# Patient Record
Sex: Male | Born: 1955
Health system: Southern US, Community
[De-identification: ages and names within clinical notes are randomized; demographics above are authoritative.]

## PROBLEM LIST (undated history)

## (undated) DIAGNOSIS — K759 Inflammatory liver disease, unspecified: Secondary | ICD-10-CM

## (undated) DIAGNOSIS — M1990 Unspecified osteoarthritis, unspecified site: Secondary | ICD-10-CM

## (undated) DIAGNOSIS — N400 Enlarged prostate without lower urinary tract symptoms: Secondary | ICD-10-CM

## (undated) DIAGNOSIS — M109 Gout, unspecified: Secondary | ICD-10-CM

## (undated) DIAGNOSIS — F1910 Other psychoactive substance abuse, uncomplicated: Secondary | ICD-10-CM

## (undated) DIAGNOSIS — Z8619 Personal history of other infectious and parasitic diseases: Secondary | ICD-10-CM

## (undated) HISTORY — DX: Other psychoactive substance abuse, uncomplicated: F19.10

## (undated) HISTORY — DX: Unspecified osteoarthritis, unspecified site: M19.90

## (undated) HISTORY — DX: Gout, unspecified: M10.9

## (undated) HISTORY — PX: WRIST SURGERY: SHX841

## (undated) HISTORY — DX: Personal history of other infectious and parasitic diseases: Z86.19

---

## 2004-12-11 ENCOUNTER — Inpatient Hospital Stay (HOSPITAL_COMMUNITY): Admission: EM | Admit: 2004-12-11 | Discharge: 2004-12-13 | Payer: Self-pay | Admitting: Emergency Medicine

## 2004-12-12 ENCOUNTER — Ambulatory Visit: Payer: Self-pay | Admitting: Psychiatry

## 2005-11-02 ENCOUNTER — Emergency Department (HOSPITAL_COMMUNITY): Admission: EM | Admit: 2005-11-02 | Discharge: 2005-11-02 | Payer: Self-pay | Admitting: Emergency Medicine

## 2006-04-19 ENCOUNTER — Emergency Department (HOSPITAL_COMMUNITY): Admission: EM | Admit: 2006-04-19 | Discharge: 2006-04-19 | Payer: Self-pay | Admitting: Emergency Medicine

## 2006-08-10 ENCOUNTER — Ambulatory Visit (HOSPITAL_COMMUNITY): Admission: RE | Admit: 2006-08-10 | Discharge: 2006-08-10 | Payer: Self-pay | Admitting: Orthopedic Surgery

## 2007-04-13 IMAGING — CR DG ORBITS FOR FOREIGN BODY
2 series · 2 of 2 positions shown · non-contrast
Comparison: none

CLINICAL DATA: Metal working/exposure; clearance prior to MRI.
 ORBITS FOR FOREIGN BODY -  VIEW:
 There is no evidence of metallic foreign body within the orbits.  No significant bone abnormality identified.

[view not recorded (1 of 2)]
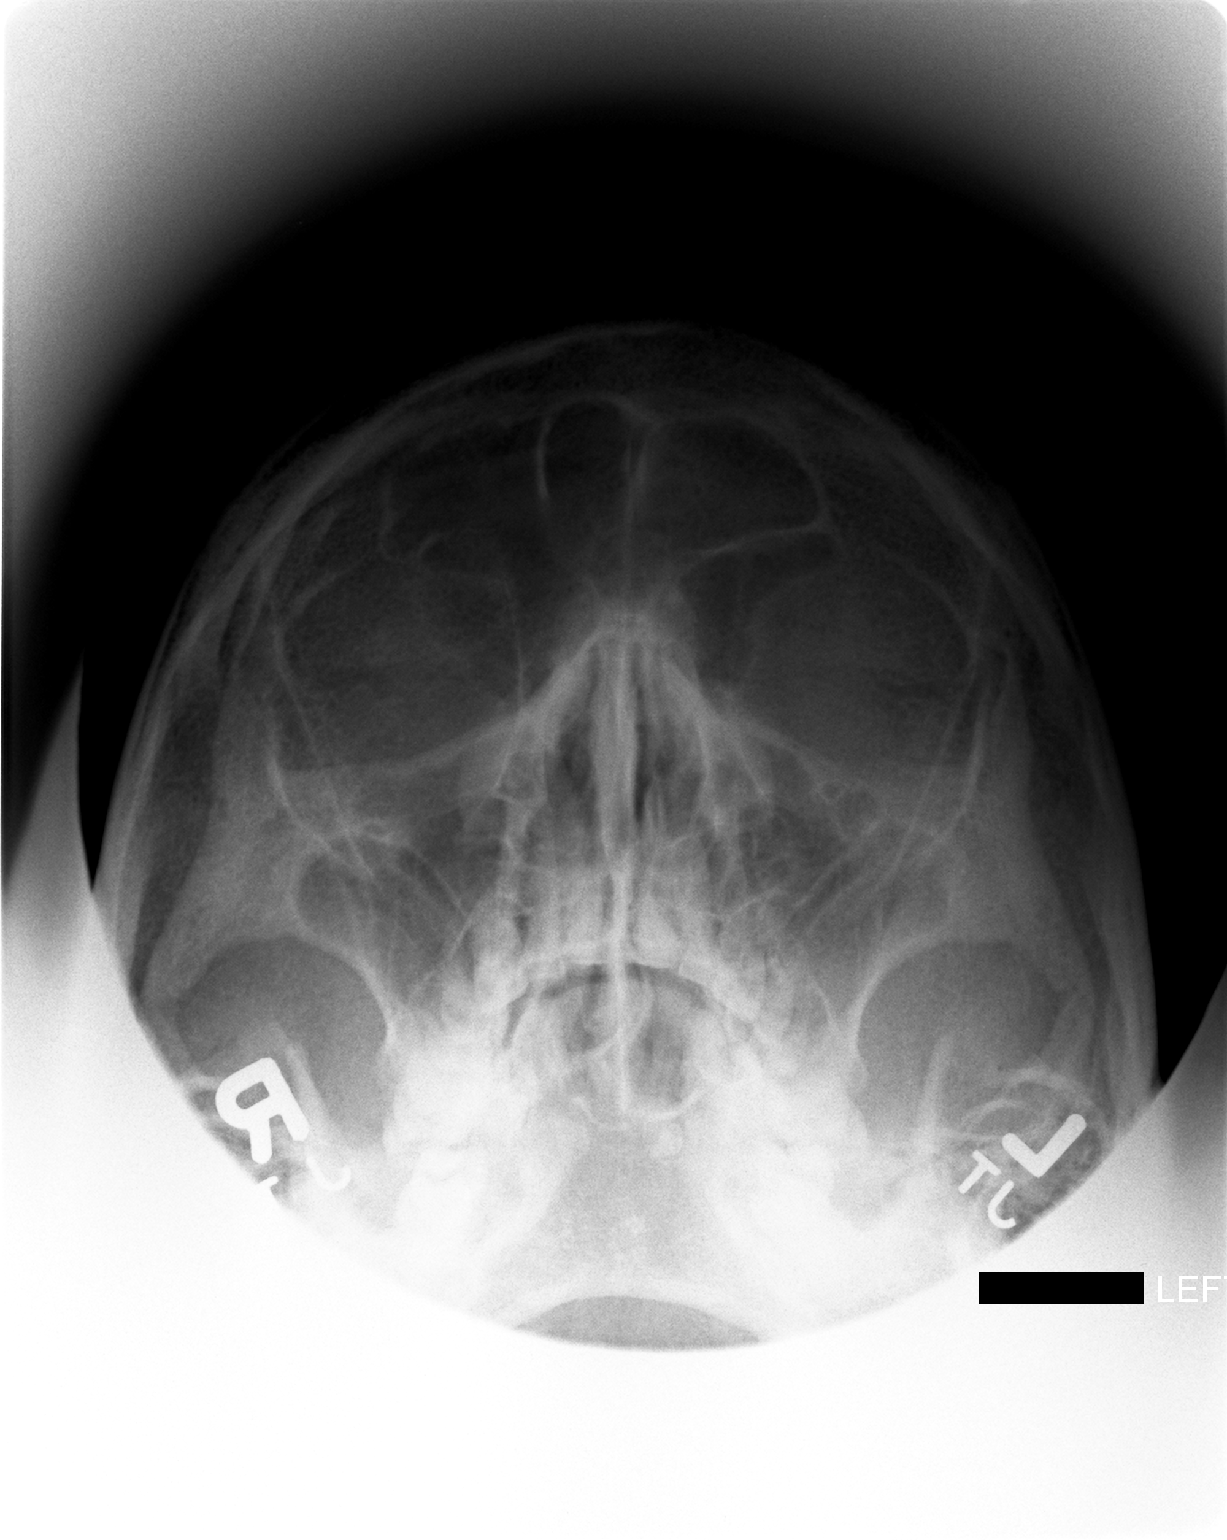

[view not recorded (2 of 2)]
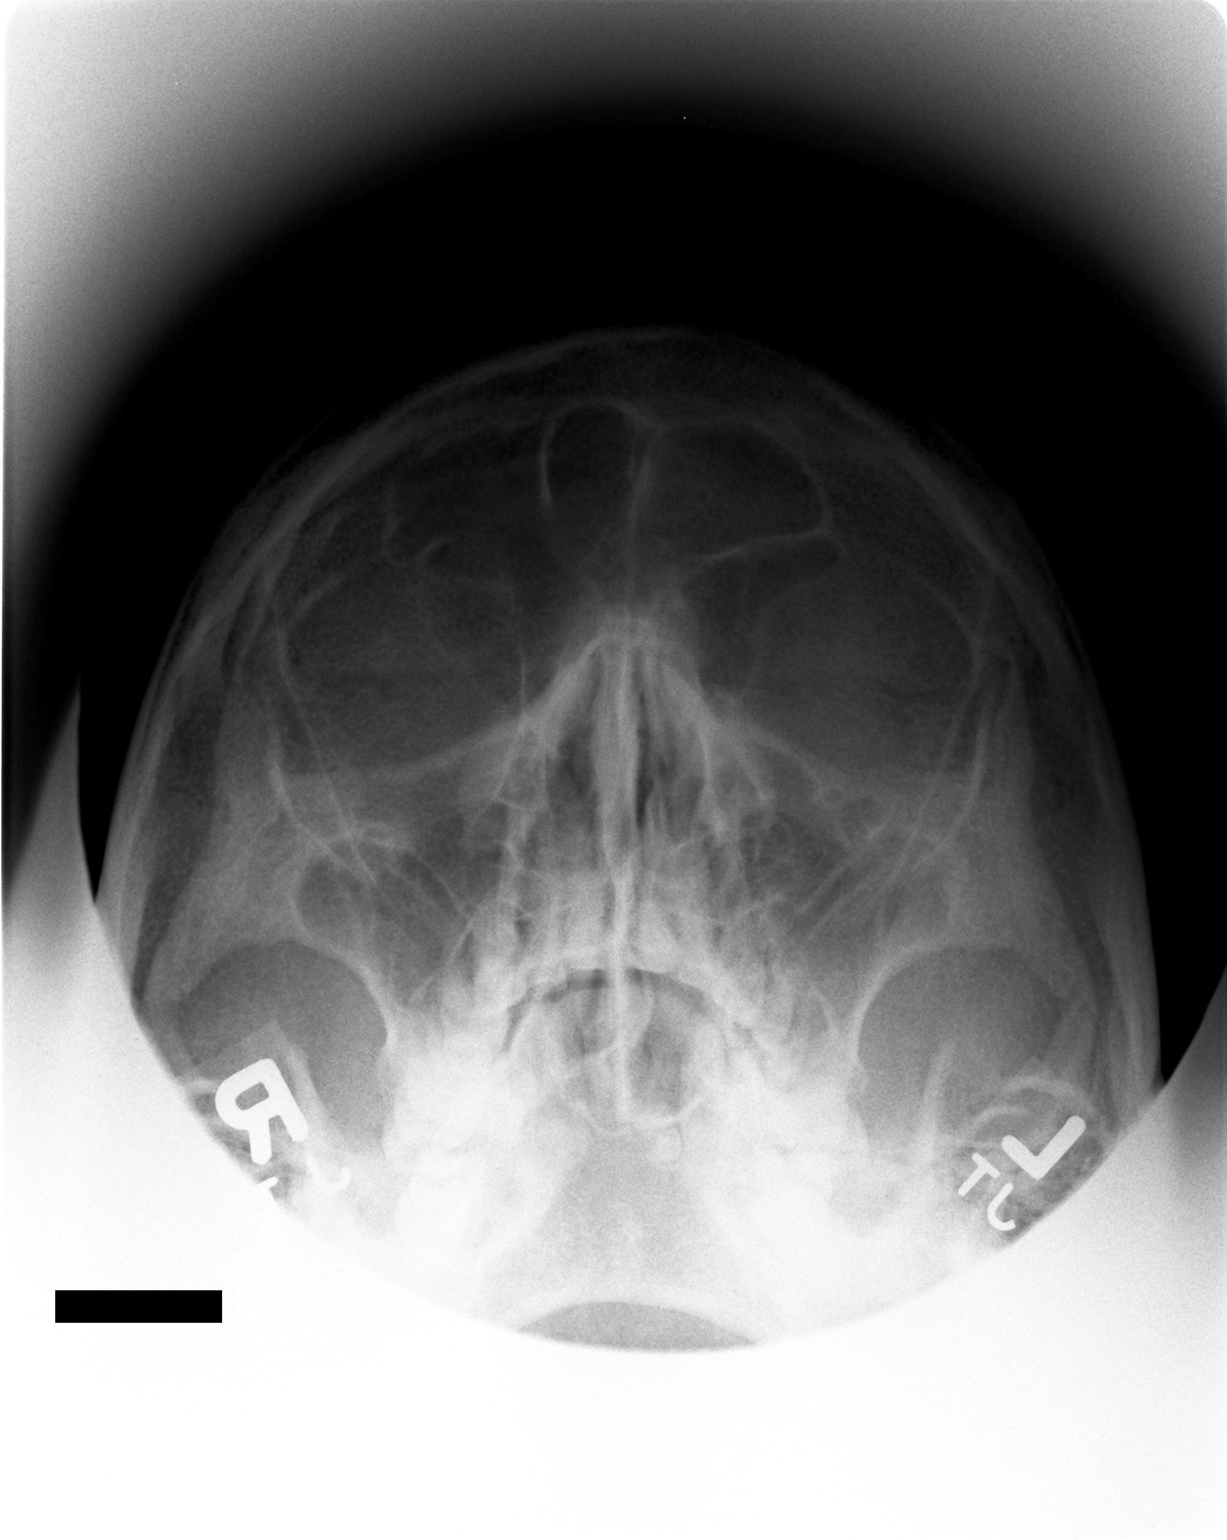

[2 of 2 positions shown; findings below may reference images not displayed]

IMPRESSION: No evidence of metallic foreign body within the orbits.

## 2008-11-24 ENCOUNTER — Emergency Department (HOSPITAL_COMMUNITY): Admission: EM | Admit: 2008-11-24 | Discharge: 2008-11-24 | Payer: Self-pay | Admitting: Emergency Medicine

## 2009-07-28 IMAGING — CR DG CHEST 2V
2 series · 2 of 2 positions shown · non-contrast
Comparison: None

CLINICAL DATA: Cough, chest pain

CHEST - 2 VIEW

[w chest pa]
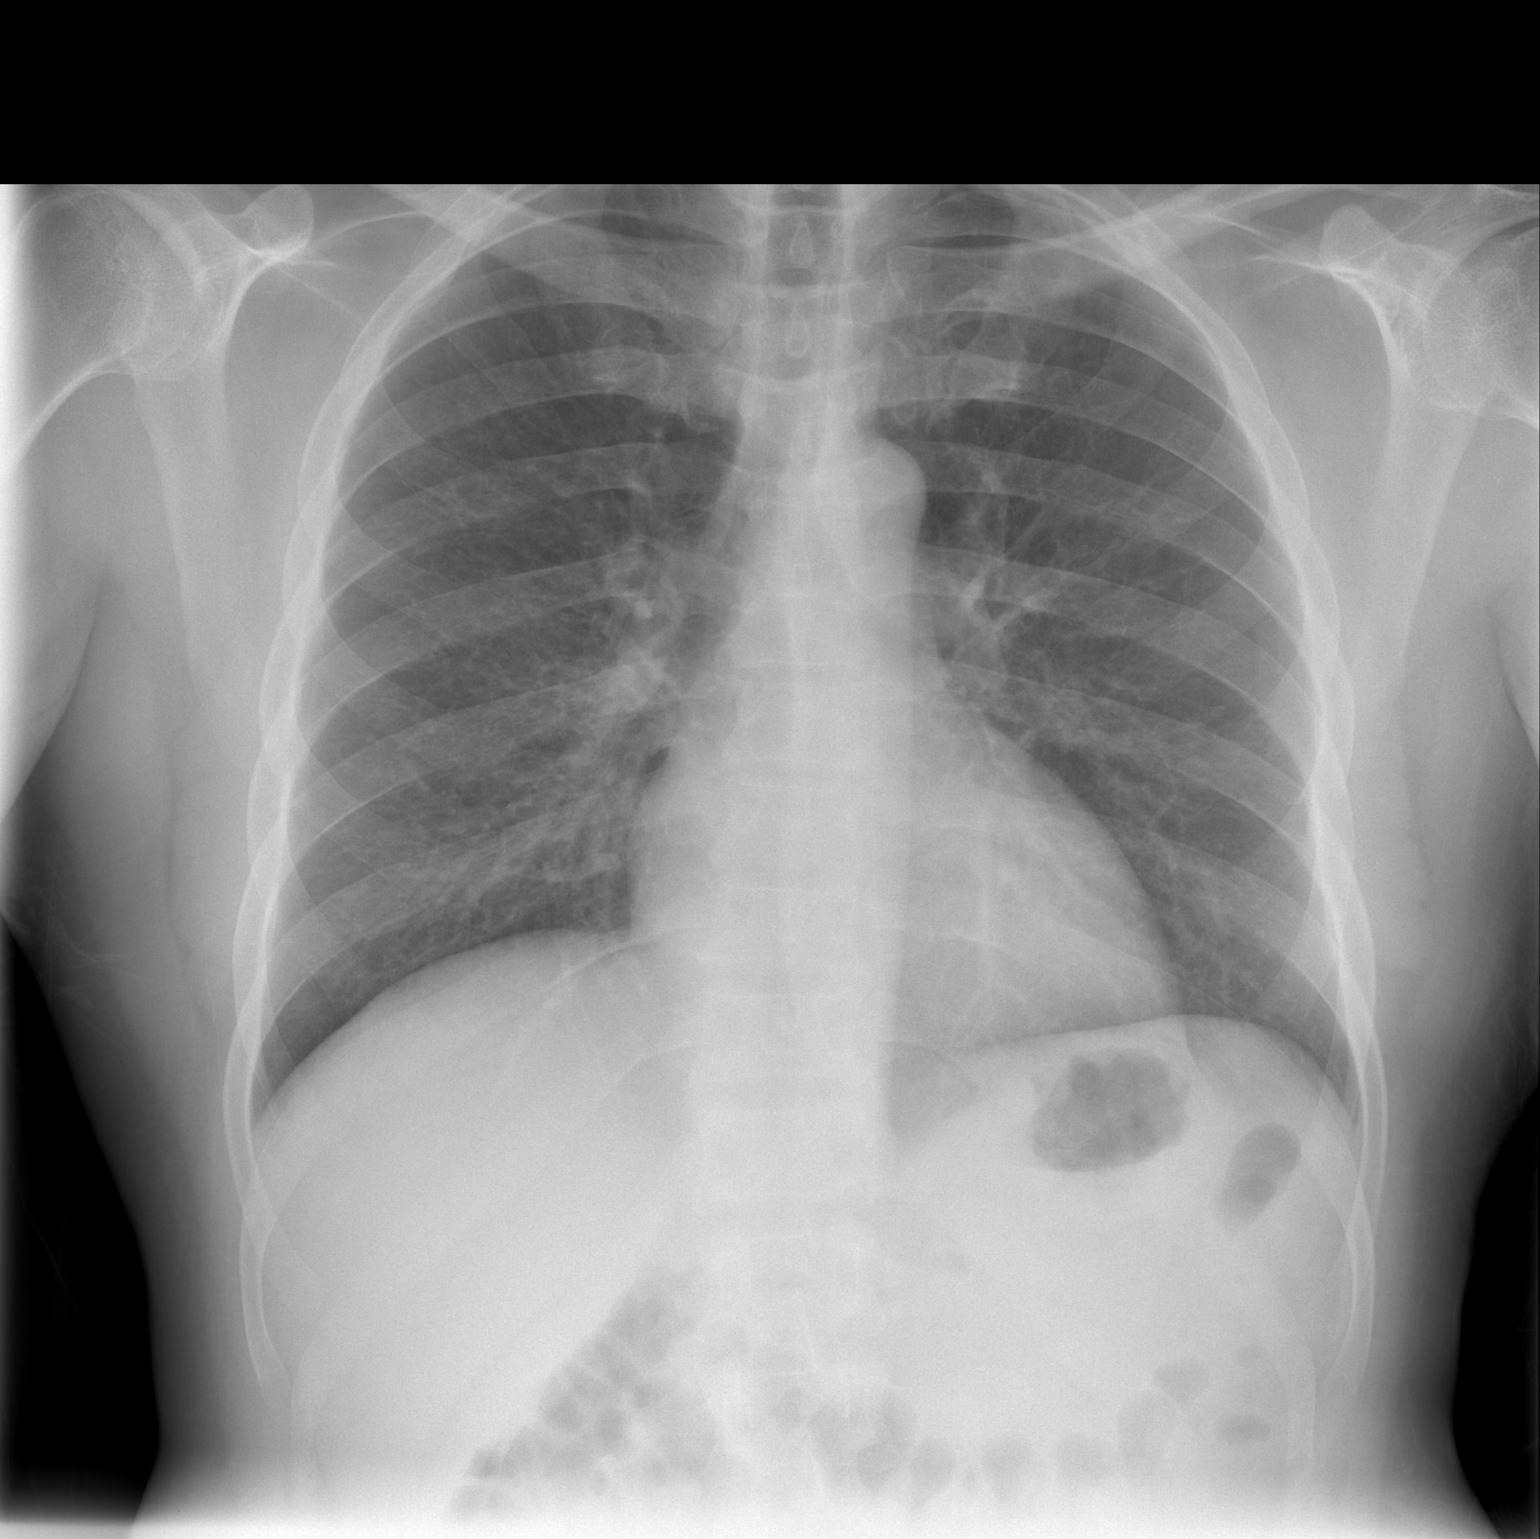

[w chest lat]
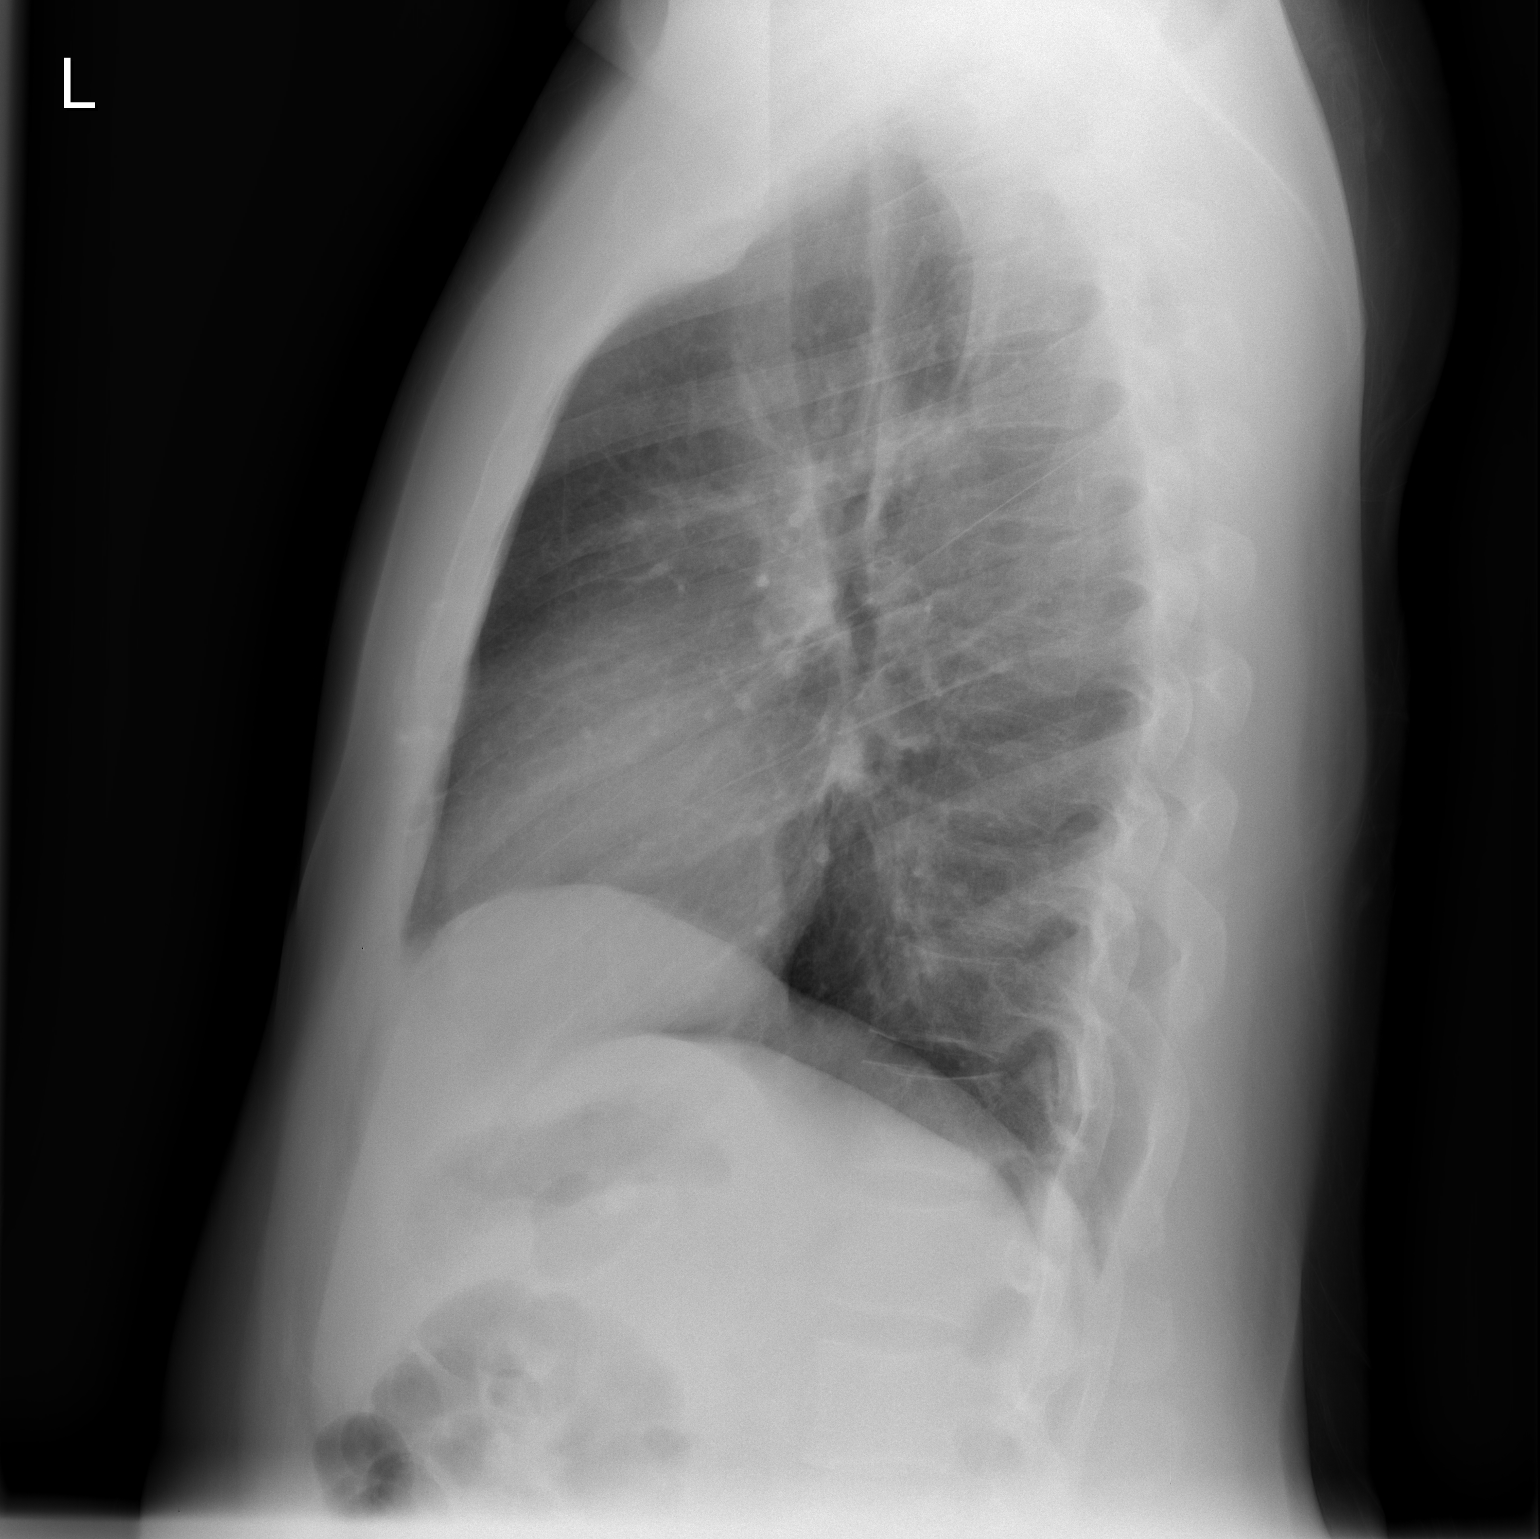

[2 of 2 positions shown; findings below may reference images not displayed]

FINDINGS: The heart size and mediastinal contours are within normal
limits.  Both lungs are clear.  The visualized skeletal structures
are unremarkable.
IMPRESSION: No active cardiopulmonary disease.

## 2011-03-05 NOTE — H&P (Signed)
NAME:  Carl Gardner, Carl Gardner            ACCOUNT NO.:  [id number]   MEDICAL RECORD NO.:  [id number]          PATIENT TYPE:  INP   LOCATION:  1843                         FACILITY:  [facility]   PHYSICIAN:  Kela Millin, M.D.DATE OF BIRTH:  Aug 26, 1956   DATE OF ADMISSION:  12/11/2004  DATE OF DISCHARGE:                                HISTORY & PHYSICAL   PRIMARY CARE PHYSICIAN:  Unassigned.   CHIEF COMPLAINT:  Drug overdose per family.   HISTORY OF PRESENT ILLNESS:  The patient is a 55 year old black male brought  in by [employer] with above complaints.  The patient is very somnolent and unable  to give a history and so it is obtained from family as well as medical  records.  Per patient's dad and [employer] at bedside, the patient's girlfriend  called and reported that he had taken an overdose of Seroquel, Xanax and  possibly prednisone, all of which were her prescription medications.  Per  [employer], the patient admitted to suicide attempt and he did confirm in the ER  with a nod when I asked him about suicidal ideations.  Per [employer], the patient  also uses cocaine, and dad states the patient was otherwise in good health  and had not reported any complaints prior to this.   Poison Control was called per ER physician and they recommended admission  for cardiac monitoring, watching for QRS/Q-T prolongation, and supportive  care.   PAST MEDICAL HISTORY:  As above, otherwise unobtainable.   MEDICATIONS:  None.   ALLERGIES:  Unknown, unobtainable.   SOCIAL HISTORY:  Positive for tobacco per dad as well as cocaine per [employer].   FAMILY HISTORY:  Dad states he has borderline diabetes, otherwise unknown.   REVIEW OF SYSTEMS:  As per HPI, otherwise unobtainable.   PHYSICAL EXAM:  GENERAL:  In general, the patient is a middle-aged black  male, somnolent, alert to name and occasionally mutters a few words,  difficult to comprehend, not following commands.  VITAL SIGNS:  Blood pressure 134/77, pulse 60, respiratory  rate 12, O2 SATS  100%.  HEENT:  PERRL, EOMI; mucous membranes moist.  NECK:  Supple, no adenopathy, no thyromegaly.  LUNGS:  Clear to auscultation bilaterally, no crackles or wheezes.  CARDIOVASCULAR:  Regular rate and rhythm, normal S1 and S2.  ABDOMEN:  Soft, bowel sounds presents, nontender and non-distended.  No  organomegaly and no masses palpable.  EXTREMITIES:  No clubbing, cyanosis, or edema.  NEUROLOGIC:  Somnolent, alert to name, otherwise not following commands.  Moving all extremities grossly.   LABORATORY DATA:  Urine drug screen, alcohol level, Tylenol level,  salicylate level, chemistries, all pending at this time.   ASSESSMENT AND PLAN:  1.  Drug overdose -- we will admit to a stepdown unit for close monitoring      and supportive care.  One-on-one observation as suicide precautions,      will consult psychiatry in a.m.  Follow laboratory data.  2.  History of cocaine abuse -- psychiatric consult as above.      ACV/MEDQ  D:  12/11/2004  T:  12/11/2004  Job:  [id number] 

## 2014-03-13 ENCOUNTER — Encounter: Payer: Self-pay | Admitting: Internal Medicine

## 2014-05-21 ENCOUNTER — Encounter: Payer: Self-pay | Admitting: Internal Medicine

## 2014-06-12 ENCOUNTER — Other Ambulatory Visit: Payer: Self-pay | Admitting: Nurse Practitioner

## 2014-06-12 DIAGNOSIS — C220 Liver cell carcinoma: Secondary | ICD-10-CM

## 2014-06-14 ENCOUNTER — Other Ambulatory Visit: Payer: Self-pay | Admitting: Orthopedic Surgery

## 2014-07-15 ENCOUNTER — Ambulatory Visit (HOSPITAL_BASED_OUTPATIENT_CLINIC_OR_DEPARTMENT_OTHER): Admission: RE | Admit: 2014-07-15 | Payer: Self-pay | Source: Ambulatory Visit | Admitting: Orthopedic Surgery

## 2014-07-15 ENCOUNTER — Encounter (HOSPITAL_BASED_OUTPATIENT_CLINIC_OR_DEPARTMENT_OTHER): Admission: RE | Payer: Self-pay | Source: Ambulatory Visit

## 2014-07-15 SURGERY — SHOULDER ARTHROSCOPY WITH SUBACROMIAL DECOMPRESSION
Anesthesia: Choice | Laterality: Left

## 2014-07-22 ENCOUNTER — Other Ambulatory Visit: Payer: Self-pay

## 2015-06-05 ENCOUNTER — Other Ambulatory Visit: Payer: Self-pay | Admitting: Orthopedic Surgery

## 2015-06-25 ENCOUNTER — Encounter (HOSPITAL_BASED_OUTPATIENT_CLINIC_OR_DEPARTMENT_OTHER)
Admission: RE | Admit: 2015-06-25 | Discharge: 2015-06-25 | Disposition: A | Discharge status: Home / Self Care | Payer: 59 | Source: Ambulatory Visit | Attending: Orthopedic Surgery | Admitting: Orthopedic Surgery

## 2015-06-25 ENCOUNTER — Encounter (HOSPITAL_BASED_OUTPATIENT_CLINIC_OR_DEPARTMENT_OTHER): Payer: Self-pay | Admitting: *Deleted

## 2015-06-25 DIAGNOSIS — M75112 Incomplete rotator cuff tear or rupture of left shoulder, not specified as traumatic: Secondary | ICD-10-CM | POA: Diagnosis present

## 2015-06-25 DIAGNOSIS — Z87891 Personal history of nicotine dependence: Secondary | ICD-10-CM | POA: Diagnosis not present

## 2015-06-25 DIAGNOSIS — Y9389 Activity, other specified: Secondary | ICD-10-CM | POA: Diagnosis not present

## 2015-06-25 DIAGNOSIS — N400 Enlarged prostate without lower urinary tract symptoms: Secondary | ICD-10-CM | POA: Diagnosis not present

## 2015-06-25 DIAGNOSIS — Z6832 Body mass index (BMI) 32.0-32.9, adult: Secondary | ICD-10-CM | POA: Diagnosis not present

## 2015-06-25 DIAGNOSIS — X58XXXA Exposure to other specified factors, initial encounter: Secondary | ICD-10-CM | POA: Diagnosis not present

## 2015-06-25 DIAGNOSIS — Z880 Allergy status to penicillin: Secondary | ICD-10-CM | POA: Diagnosis not present

## 2015-06-25 DIAGNOSIS — S43432A Superior glenoid labrum lesion of left shoulder, initial encounter: Secondary | ICD-10-CM | POA: Diagnosis not present

## 2015-06-25 DIAGNOSIS — Y998 Other external cause status: Secondary | ICD-10-CM | POA: Diagnosis not present

## 2015-06-25 DIAGNOSIS — Y9289 Other specified places as the place of occurrence of the external cause: Secondary | ICD-10-CM | POA: Diagnosis not present

## 2015-06-25 DIAGNOSIS — B1920 Unspecified viral hepatitis C without hepatic coma: Secondary | ICD-10-CM | POA: Diagnosis not present

## 2015-06-25 DIAGNOSIS — E669 Obesity, unspecified: Secondary | ICD-10-CM | POA: Diagnosis not present

## 2015-06-27 ENCOUNTER — Encounter (HOSPITAL_BASED_OUTPATIENT_CLINIC_OR_DEPARTMENT_OTHER)
Admission: RE | Admit: 2015-06-27 | Discharge: 2015-06-27 | Disposition: A | Discharge status: Home / Self Care | Payer: 59 | Source: Ambulatory Visit | Attending: Orthopedic Surgery | Admitting: Orthopedic Surgery

## 2015-06-27 DIAGNOSIS — M75112 Incomplete rotator cuff tear or rupture of left shoulder, not specified as traumatic: Secondary | ICD-10-CM | POA: Diagnosis not present

## 2015-06-27 LAB — COMPREHENSIVE METABOLIC PANEL
ALK PHOS: 72 U/L (ref 38–126)
ALT: 36 U/L (ref 17–63)
ANION GAP: 6 (ref 5–15)
AST: 37 U/L (ref 15–41)
Albumin: 3.9 g/dL (ref 3.5–5.0)
BUN: 21 mg/dL — AB (ref 6–20)
CO2: 24 mmol/L (ref 22–32)
Calcium: 8.6 mg/dL — ABNORMAL LOW (ref 8.9–10.3)
Chloride: 108 mmol/L (ref 101–111)
Creatinine, Ser: 1.34 mg/dL — ABNORMAL HIGH (ref 0.61–1.24)
GFR calc Af Amer: >60 mL/min (ref >60)
GFR calc non Af Amer: 57 mL/min — ABNORMAL LOW (ref >60)
GLUCOSE: 135 mg/dL — AB (ref 65–99)
POTASSIUM: 4.1 mmol/L (ref 3.5–5.1)
Sodium: 138 mmol/L (ref 135–145)
Total Bilirubin: 0.8 mg/dL (ref 0.3–1.2)
Total Protein: 6.7 g/dL (ref 6.5–8.1)

## 2015-06-30 ENCOUNTER — Encounter (HOSPITAL_BASED_OUTPATIENT_CLINIC_OR_DEPARTMENT_OTHER): Payer: Self-pay | Admitting: *Deleted

## 2015-06-30 ENCOUNTER — Ambulatory Visit (HOSPITAL_BASED_OUTPATIENT_CLINIC_OR_DEPARTMENT_OTHER): Payer: 59 | Admitting: Anesthesiology

## 2015-06-30 ENCOUNTER — Ambulatory Visit (HOSPITAL_BASED_OUTPATIENT_CLINIC_OR_DEPARTMENT_OTHER)
Admission: RE | Admit: 2015-06-30 | Discharge: 2015-06-30 | Disposition: A | Discharge status: Home / Self Care | Payer: 59 | Source: Ambulatory Visit | Attending: Orthopedic Surgery | Admitting: Orthopedic Surgery

## 2015-06-30 ENCOUNTER — Encounter (HOSPITAL_BASED_OUTPATIENT_CLINIC_OR_DEPARTMENT_OTHER)
Admission: RE | Disposition: A | Discharge status: Home / Self Care | Payer: Self-pay | Source: Ambulatory Visit | Attending: Orthopedic Surgery

## 2015-06-30 DIAGNOSIS — N400 Enlarged prostate without lower urinary tract symptoms: Secondary | ICD-10-CM | POA: Insufficient documentation

## 2015-06-30 DIAGNOSIS — Z87891 Personal history of nicotine dependence: Secondary | ICD-10-CM | POA: Insufficient documentation

## 2015-06-30 DIAGNOSIS — Z880 Allergy status to penicillin: Secondary | ICD-10-CM | POA: Insufficient documentation

## 2015-06-30 DIAGNOSIS — Y9389 Activity, other specified: Secondary | ICD-10-CM | POA: Insufficient documentation

## 2015-06-30 DIAGNOSIS — E669 Obesity, unspecified: Secondary | ICD-10-CM | POA: Insufficient documentation

## 2015-06-30 DIAGNOSIS — Y9289 Other specified places as the place of occurrence of the external cause: Secondary | ICD-10-CM | POA: Insufficient documentation

## 2015-06-30 DIAGNOSIS — Z6832 Body mass index (BMI) 32.0-32.9, adult: Secondary | ICD-10-CM | POA: Insufficient documentation

## 2015-06-30 DIAGNOSIS — Y998 Other external cause status: Secondary | ICD-10-CM | POA: Insufficient documentation

## 2015-06-30 DIAGNOSIS — M75112 Incomplete rotator cuff tear or rupture of left shoulder, not specified as traumatic: Secondary | ICD-10-CM | POA: Insufficient documentation

## 2015-06-30 DIAGNOSIS — S43432A Superior glenoid labrum lesion of left shoulder, initial encounter: Secondary | ICD-10-CM | POA: Insufficient documentation

## 2015-06-30 DIAGNOSIS — B1920 Unspecified viral hepatitis C without hepatic coma: Secondary | ICD-10-CM | POA: Insufficient documentation

## 2015-06-30 DIAGNOSIS — X58XXXA Exposure to other specified factors, initial encounter: Secondary | ICD-10-CM | POA: Insufficient documentation

## 2015-06-30 HISTORY — DX: Inflammatory liver disease, unspecified: K75.9

## 2015-06-30 HISTORY — PX: SHOULDER ARTHROSCOPY WITH DISTAL CLAVICLE RESECTION: SHX5675

## 2015-06-30 HISTORY — PX: SHOULDER ARTHROSCOPY WITH ROTATOR CUFF REPAIR AND SUBACROMIAL DECOMPRESSION: SHX5686

## 2015-06-30 HISTORY — DX: Benign prostatic hyperplasia without lower urinary tract symptoms: N40.0

## 2015-06-30 SURGERY — SHOULDER ARTHROSCOPY WITH ROTATOR CUFF REPAIR AND SUBACROMIAL DECOMPRESSION
Anesthesia: Regional | Site: Shoulder | Laterality: Left

## 2015-06-30 MED ORDER — DEXAMETHASONE SODIUM PHOSPHATE 10 MG/ML IJ SOLN
INTRAMUSCULAR | Status: AC
  Filled 2015-06-30: qty 1

## 2015-06-30 MED ORDER — FENTANYL CITRATE (PF) 100 MCG/2ML IJ SOLN
50.0000 ug | INTRAMUSCULAR | Status: DC | PRN
  Administered 2015-06-30: 25 ug via INTRAVENOUS
  Administered 2015-06-30: 50 ug via INTRAVENOUS

## 2015-06-30 MED ORDER — OXYCODONE-ACETAMINOPHEN 5-325 MG PO TABS: 1.0000 | ORAL_TABLET | ORAL | Status: DC | PRN

## 2015-06-30 MED ORDER — POVIDONE-IODINE 7.5 % EX SOLN: Freq: Once | CUTANEOUS | Status: DC

## 2015-06-30 MED ORDER — SUCCINYLCHOLINE CHLORIDE 20 MG/ML IJ SOLN
INTRAMUSCULAR | Status: DC | PRN
  Administered 2015-06-30: 100 mg via INTRAVENOUS

## 2015-06-30 MED ORDER — ONDANSETRON HCL 4 MG/2ML IJ SOLN
INTRAMUSCULAR | Status: AC
  Filled 2015-06-30: qty 2

## 2015-06-30 MED ORDER — FENTANYL CITRATE (PF) 100 MCG/2ML IJ SOLN
INTRAMUSCULAR | Status: AC
  Filled 2015-06-30: qty 2

## 2015-06-30 MED ORDER — DOCUSATE SODIUM 100 MG PO CAPS: 100.0000 mg | ORAL_CAPSULE | Freq: Three times a day (TID) | ORAL | Status: DC | PRN

## 2015-06-30 MED ORDER — CEFAZOLIN SODIUM-DEXTROSE 2-3 GM-% IV SOLR: 2.0000 g | INTRAVENOUS | Status: DC

## 2015-06-30 MED ORDER — LIDOCAINE HCL (CARDIAC) 20 MG/ML IV SOLN
INTRAVENOUS | Status: AC
  Filled 2015-06-30: qty 5

## 2015-06-30 MED ORDER — BUPIVACAINE-EPINEPHRINE (PF) 0.5% -1:200000 IJ SOLN
INTRAMUSCULAR | Status: DC | PRN
  Administered 2015-06-30: 20 mL

## 2015-06-30 MED ORDER — SCOPOLAMINE 1 MG/3DAYS TD PT72: 1.0000 | MEDICATED_PATCH | Freq: Once | TRANSDERMAL | Status: DC | PRN

## 2015-06-30 MED ORDER — FENTANYL CITRATE (PF) 100 MCG/2ML IJ SOLN
INTRAMUSCULAR | Status: AC
  Filled 2015-06-30: qty 4

## 2015-06-30 MED ORDER — MIDAZOLAM HCL 2 MG/2ML IJ SOLN
INTRAMUSCULAR | Status: AC
  Filled 2015-06-30: qty 2

## 2015-06-30 MED ORDER — PROPOFOL 10 MG/ML IV BOLUS
INTRAVENOUS | Status: AC
  Filled 2015-06-30: qty 20

## 2015-06-30 MED ORDER — LACTATED RINGERS IV SOLN
INTRAVENOUS | Status: DC
  Administered 2015-06-30 (×2): via INTRAVENOUS

## 2015-06-30 MED ORDER — BUPIVACAINE HCL (PF) 0.5 % IJ SOLN
INTRAMUSCULAR | Status: AC
  Filled 2015-06-30: qty 30

## 2015-06-30 MED ORDER — ONDANSETRON HCL 4 MG/2ML IJ SOLN
INTRAMUSCULAR | Status: DC | PRN
  Administered 2015-06-30: 4 mg via INTRAVENOUS

## 2015-06-30 MED ORDER — CLINDAMYCIN PHOSPHATE 900 MG/50ML IV SOLN
INTRAVENOUS | Status: DC | PRN
  Administered 2015-06-30: 900 mg via INTRAVENOUS

## 2015-06-30 MED ORDER — FENTANYL CITRATE (PF) 100 MCG/2ML IJ SOLN: 25.0000 ug | INTRAMUSCULAR | Status: DC | PRN

## 2015-06-30 MED ORDER — CEFAZOLIN SODIUM-DEXTROSE 2-3 GM-% IV SOLR
INTRAVENOUS | Status: AC
  Filled 2015-06-30: qty 50

## 2015-06-30 MED ORDER — SODIUM CHLORIDE 0.9 % IR SOLN
Status: DC | PRN
  Administered 2015-06-30: 6000 mL

## 2015-06-30 MED ORDER — MIDAZOLAM HCL 2 MG/2ML IJ SOLN
1.0000 mg | INTRAMUSCULAR | Status: DC | PRN
Start: 2015-06-30 — End: 2015-06-30
  Administered 2015-06-30: 2 mg via INTRAVENOUS

## 2015-06-30 MED ORDER — PROMETHAZINE HCL 25 MG/ML IJ SOLN: 6.2500 mg | INTRAMUSCULAR | Status: DC | PRN

## 2015-06-30 MED ORDER — CLINDAMYCIN PHOSPHATE 900 MG/50ML IV SOLN
INTRAVENOUS | Status: AC
  Filled 2015-06-30: qty 50

## 2015-06-30 MED ORDER — DEXAMETHASONE SODIUM PHOSPHATE 4 MG/ML IJ SOLN
INTRAMUSCULAR | Status: DC | PRN
  Administered 2015-06-30: 10 mg via INTRAVENOUS

## 2015-06-30 MED ORDER — PROPOFOL 10 MG/ML IV BOLUS
INTRAVENOUS | Status: DC | PRN
  Administered 2015-06-30: 200 mg via INTRAVENOUS

## 2015-06-30 MED ORDER — GLYCOPYRROLATE 0.2 MG/ML IJ SOLN: 0.2000 mg | Freq: Once | INTRAMUSCULAR | Status: DC | PRN

## 2015-06-30 MED ORDER — LIDOCAINE HCL (CARDIAC) 20 MG/ML IV SOLN
INTRAVENOUS | Status: DC | PRN
  Administered 2015-06-30: 50 mg via INTRAVENOUS

## 2015-06-30 SURGICAL SUPPLY — 88 items
ANCH SUT SWLK 19.1X4.75 VT (Anchor) ×2 IMPLANT
ANCHOR PEEK 4.75X19.1 SWLK C (Anchor) ×4 IMPLANT
APL SKNCLS STERI-STRIP NONHPOA (GAUZE/BANDAGES/DRESSINGS)
BENZOIN TINCTURE PRP APPL 2/3 (GAUZE/BANDAGES/DRESSINGS) IMPLANT
BLADE CLIPPER SURG (BLADE) IMPLANT
BLADE SURG 15 STRL LF DISP TIS (BLADE) IMPLANT
BLADE SURG 15 STRL SS (BLADE)
BUR OVAL 4.0 (BURR) ×3 IMPLANT
CANNULA 5.75X71 LONG (CANNULA) ×3 IMPLANT
CANNULA TWIST IN 8.25X7CM (CANNULA) ×2 IMPLANT
CHLORAPREP W/TINT 26ML (MISCELLANEOUS) ×3 IMPLANT
CLOSURE WOUND 1/2 X4 (GAUZE/BANDAGES/DRESSINGS)
DECANTER SPIKE VIAL GLASS SM (MISCELLANEOUS) IMPLANT
DRAPE INCISE IOBAN 66X45 STRL (DRAPES) ×3 IMPLANT
DRAPE STERI 35X30 U-POUCH (DRAPES) ×3 IMPLANT
DRAPE SURG 17X23 STRL (DRAPES) ×3 IMPLANT
DRAPE U 20/CS (DRAPES) ×3 IMPLANT
DRAPE U-SHAPE 47X51 STRL (DRAPES) ×3 IMPLANT
DRAPE U-SHAPE 76X120 STRL (DRAPES) ×6 IMPLANT
DRSG PAD ABDOMINAL 8X10 ST (GAUZE/BANDAGES/DRESSINGS) ×3 IMPLANT
ELECT REM PT RETURN 9FT ADLT (ELECTROSURGICAL) ×3
ELECTRODE REM PT RTRN 9FT ADLT (ELECTROSURGICAL) ×1 IMPLANT
GAUZE SPONGE 4X4 12PLY STRL (GAUZE/BANDAGES/DRESSINGS) ×3 IMPLANT
GAUZE SPONGE 4X4 16PLY XRAY LF (GAUZE/BANDAGES/DRESSINGS) IMPLANT
GAUZE XEROFORM 1X8 LF (GAUZE/BANDAGES/DRESSINGS) ×3 IMPLANT
GLOVE BIO SURGEON STRL SZ7 (GLOVE) ×3 IMPLANT
GLOVE BIO SURGEON STRL SZ7.5 (GLOVE) ×3 IMPLANT
GLOVE BIOGEL PI IND STRL 7.0 (GLOVE) ×1 IMPLANT
GLOVE BIOGEL PI IND STRL 8 (GLOVE) ×1 IMPLANT
GLOVE BIOGEL PI INDICATOR 7.0 (GLOVE) ×4
GLOVE BIOGEL PI INDICATOR 8 (GLOVE) ×2
GLOVE ECLIPSE 6.5 STRL STRAW (GLOVE) ×4 IMPLANT
GOWN STRL REUS W/ TWL LRG LVL3 (GOWN DISPOSABLE) ×2 IMPLANT
GOWN STRL REUS W/ TWL XL LVL3 (GOWN DISPOSABLE) ×1 IMPLANT
GOWN STRL REUS W/TWL LRG LVL3 (GOWN DISPOSABLE) ×6
GOWN STRL REUS W/TWL XL LVL3 (GOWN DISPOSABLE) ×6
LASSO CRESCENT QUICKPASS (SUTURE) IMPLANT
LIQUID BAND (GAUZE/BANDAGES/DRESSINGS) IMPLANT
MANIFOLD NEPTUNE II (INSTRUMENTS) ×3 IMPLANT
NDL 1/2 CIR CATGUT .05X1.09 (NEEDLE) IMPLANT
NDL SCORPION MULTI FIRE (NEEDLE) IMPLANT
NDL SUT 6 .5 CRC .975X.05 MAYO (NEEDLE) IMPLANT
NEEDLE 1/2 CIR CATGUT .05X1.09 (NEEDLE) IMPLANT
NEEDLE MAYO TAPER (NEEDLE)
NEEDLE SCORPION MULTI FIRE (NEEDLE) ×3 IMPLANT
NS IRRIG 1000ML POUR BTL (IV SOLUTION) IMPLANT
PACK ARTHROSCOPY DSU (CUSTOM PROCEDURE TRAY) ×3 IMPLANT
PACK BASIN DAY SURGERY FS (CUSTOM PROCEDURE TRAY) ×3 IMPLANT
PENCIL BUTTON HOLSTER BLD 10FT (ELECTRODE) IMPLANT
RESECTOR FULL RADIUS 4.2MM (BLADE) ×3 IMPLANT
SHEET MEDIUM DRAPE 40X70 STRL (DRAPES) IMPLANT
SLEEVE SCD COMPRESS KNEE MED (MISCELLANEOUS) ×3 IMPLANT
SLING ARM IMMOBILIZER MED (SOFTGOODS) IMPLANT
SLING ARM IMMOBILIZER XL (CAST SUPPLIES) ×2 IMPLANT
SLING ARM LRG ADULT FOAM STRAP (SOFTGOODS) ×2 IMPLANT
SLING ARM MED ADULT FOAM STRAP (SOFTGOODS) IMPLANT
SLING ARM XL FOAM STRAP (SOFTGOODS) IMPLANT
SPONGE LAP 4X18 X RAY DECT (DISPOSABLE) IMPLANT
STRIP CLOSURE SKIN 1/2X4 (GAUZE/BANDAGES/DRESSINGS) IMPLANT
SUCTION FRAZIER TIP 10 FR DISP (SUCTIONS) IMPLANT
SUPPORT WRAP ARM LG (MISCELLANEOUS) IMPLANT
SUT BONE WAX W31G (SUTURE) IMPLANT
SUT ETHIBOND 2 OS 4 DA (SUTURE) IMPLANT
SUT ETHILON 3 0 PS 1 (SUTURE) ×3 IMPLANT
SUT ETHILON 4 0 PS 2 18 (SUTURE) IMPLANT
SUT FIBERWIRE #2 38 T-5 BLUE (SUTURE)
SUT FIBERWIRE 2-0 18 17.9 3/8 (SUTURE)
SUT MNCRL AB 3-0 PS2 18 (SUTURE) IMPLANT
SUT MNCRL AB 4-0 PS2 18 (SUTURE) IMPLANT
SUT PDS AB 0 CT 36 (SUTURE) IMPLANT
SUT PROLENE 3 0 PS 2 (SUTURE) IMPLANT
SUT TIGER TAPE 7 IN WHITE (SUTURE) ×2 IMPLANT
SUT VIC AB 0 CT1 27 (SUTURE)
SUT VIC AB 0 CT1 27XBRD ANBCTR (SUTURE) IMPLANT
SUT VIC AB 2-0 SH 27 (SUTURE)
SUT VIC AB 2-0 SH 27XBRD (SUTURE) IMPLANT
SUTURE FIBERWR #2 38 T-5 BLUE (SUTURE) IMPLANT
SUTURE FIBERWR 2-0 18 17.9 3/8 (SUTURE) IMPLANT
SYR BULB 3OZ (MISCELLANEOUS) IMPLANT
TAPE FIBER 2MM 7IN #2 BLUE (SUTURE) IMPLANT
TOWEL OR 17X24 6PK STRL BLUE (TOWEL DISPOSABLE) ×3 IMPLANT
TOWEL OR NON WOVEN STRL DISP B (DISPOSABLE) ×3 IMPLANT
TUBE CONNECTING 20'X1/4 (TUBING) ×1
TUBE CONNECTING 20X1/4 (TUBING) ×2 IMPLANT
TUBING ARTHROSCOPY IRRIG 16FT (MISCELLANEOUS) ×3 IMPLANT
WAND STAR VAC 90 (SURGICAL WAND) ×3 IMPLANT
WATER STERILE IRR 1000ML POUR (IV SOLUTION) ×3 IMPLANT
YANKAUER SUCT BULB TIP NO VENT (SUCTIONS) IMPLANT

## 2015-06-30 NOTE — Progress Notes (Signed)
Assisted Dr. Turk with left, ultrasound guided, supraclavicular block. Side rails up, monitors on throughout procedure. See vital signs in flow sheet. Tolerated Procedure well. 

## 2015-06-30 NOTE — Transfer of Care (Signed)
Immediate Anesthesia Transfer of Care Note  Patient: Carl Gardner  Procedure(s) Performed: Procedure(s) with comments: SHOULDER ARTHROSCOPY ROTATOR CUFF DEBRIDEMENT VS REPAIR,SUBACROMIAL DECOMPRESSION, DISTAL CLAVICAL EXCISION (Left) - Left shoulder arthroscopy rotator cuff debridement vs repair, subacromial decompression, distal clavical excision  Patient Location: PACU  Anesthesia Type:General and Regional  Level of Consciousness: sedated  Airway & Oxygen Therapy: Patient Spontanous Breathing and Patient connected to face mask oxygen  Post-op Assessment: Report given to RN and Post -op Vital signs reviewed and stable  Post vital signs: Reviewed and stable  Last Vitals:  Filed Vitals:   06/30/15 1504  BP:   Pulse: 81  Temp:   Resp:     Complications: No apparent anesthesia complications

## 2015-06-30 NOTE — Discharge Instructions (Signed)
Discharge Instructions after Arthroscopic Shoulder Repair ° ° °A sling has been provided for you. Remain in your sling at all times. This includes sleeping in your sling.  °Use ice on the shoulder intermittently over the first 48 hours after surgery.  °Pain medicine has been prescribed for you.  °Use your medicine liberally over the first 48 hours, and then you can begin to taper your use. You may take Extra Strength Tylenol or Tylenol only in place of the pain pills. DO NOT take ANY nonsteroidal anti-inflammatory pain medications: Advil, Motrin, Ibuprofen, Aleve, Naproxen, or Narprosyn.  °You may remove your dressing after two days. If the incision sites are still moist, place a Band-Aid over the moist site(s). Change Band-Aids daily until dry.  °You may shower 5 days after surgery. The incisions CANNOT get wet prior to 5 days. Simply allow the water to wash over the site and then pat dry. Do not rub the incisions. Make sure your axilla (armpit) is completely dry after showering.  °Take one aspirin a day for 2 weeks after surgery, unless you have an aspirin sensitivity/ allergy or asthma. ° ° °Please call [phone] during normal business hours or [phone] after hours for any problems. Including the following: ° °- excessive redness of the incisions °- drainage for more than 4 days °- fever of more than 101.5 F ° °*Please note that pain medications will not be refilled after hours or on weekends. ° ° ° °Post Anesthesia Home Care Instructions ° °Activity: °Get plenty of rest for the remainder of the day. A responsible adult should stay with you for 24 hours following the procedure.  °For the next 24 hours, DO NOT: °-Drive a car °-Operate machinery °-Drink alcoholic beverages °-Take any medication unless instructed by your physician °-Make any legal decisions or sign important papers. ° °Meals: °Start with liquid foods such as gelatin or soup. Progress to regular foods as tolerated. Avoid greasy, spicy, heavy  foods. If nausea and/or vomiting occur, drink only clear liquids until the nausea and/or vomiting subsides. Call your physician if vomiting continues. ° °Special Instructions/Symptoms: °Your throat may feel dry or sore from the anesthesia or the breathing tube placed in your throat during surgery. If this causes discomfort, gargle with warm salt water. The discomfort should disappear within 24 hours. ° °If you had a scopolamine patch placed behind your ear for the management of post- operative nausea and/or vomiting: ° °1. The medication in the patch is effective for 72 hours, after which it should be removed.  Wrap patch in a tissue and discard in the trash. Wash hands thoroughly with soap and water. °2. You may remove the patch earlier than 72 hours if you experience unpleasant side effects which may include dry mouth, dizziness or visual disturbances. °3. Avoid touching the patch. Wash your hands with soap and water after contact with the patch. °  ° °Regional Anesthesia Blocks ° °1. Numbness or the inability to move the "blocked" extremity may last from 3-48 hours after placement. The length of time depends on the medication injected and your individual response to the medication. If the numbness is not going away after 48 hours, call your surgeon. ° °2. The extremity that is blocked will need to be protected until the numbness is gone and the  Strength has returned. Because you cannot feel it, you will need to take extra care to avoid injury. Because it may be weak, you may have difficulty moving it or using it. You may   what position it is in without looking at it while the block is in effect. ° °3. For blocks in the legs and feet, returning to weight bearing and walking needs to be done carefully. You will need to wait until the numbness is entirely gone and the strength has returned. You should be able to move your leg and foot normally before you try and bear weight or walk. You will need someone to  be with you when you first try to ensure you do not fall and possibly risk injury. ° °4. Bruising and tenderness at the needle site are common side effects and will resolve in a few days. ° °5. Persistent numbness or new problems with movement should be communicated to the surgeon or the [facility] ([phone])/ [facility] ([phone]). ° ° ° °

## 2015-06-30 NOTE — Anesthesia Preprocedure Evaluation (Addendum)
Anesthesia Evaluation  Patient identified by MRN, date of birth, ID band Patient awake    Reviewed: Allergy & Precautions, NPO status , Patient's Chart, lab work & pertinent test results  Airway Mallampati: II  TM Distance: >3 FB Neck ROM: Full    Dental  (+) Dental Advisory Given, Missing, Chipped,    Pulmonary former smoker,    Pulmonary exam normal breath sounds clear to auscultation       Cardiovascular Exercise Tolerance: Good (-) hypertension(-) anginanegative cardio ROS Normal cardiovascular exam Rhythm:Regular Rate:Normal     Neuro/Psych negative neurological ROS     GI/Hepatic negative GI ROS, (+) Hepatitis -, C  Endo/Other  Obesity   Renal/GU negative Renal ROS     Musculoskeletal negative musculoskeletal ROS (+)   Abdominal   Peds  Hematology negative hematology ROS (+)   Anesthesia Other Findings Day of surgery medications reviewed with the patient.  Reproductive/Obstetrics                            Anesthesia Physical Anesthesia Plan  ASA: II  Anesthesia Plan: Regional and General   Post-op Pain Management: GA combined w/ Regional for post-op pain   Induction: Intravenous  Airway Management Planned: Oral ETT  Additional Equipment:   Intra-op Plan:   Post-operative Plan:   Informed Consent: I have reviewed the patients History and Physical, chart, labs and discussed the procedure including the risks, benefits and alternatives for the proposed anesthesia with the patient or authorized representative who has indicated his/her understanding and acceptance.   Dental advisory given  Plan Discussed with:   Anesthesia Plan Comments: (Risks/benefits of regional block discussed with patient including risk of bleeding, infection, nerve damage, and possibility of failed block.  Also discussed plan of general anesthesia and associated risks.  Patient wishes to proceed.)         Anesthesia Quick Evaluation

## 2015-06-30 NOTE — Anesthesia Procedure Notes (Addendum)
Anesthesia Regional Block:  Interscalene brachial plexus block  Pre-Anesthetic Checklist: ,, timeout performed, Correct Patient, Correct Site, Correct Laterality, Correct Procedure, Correct Position, site marked, Risks and benefits discussed,  Surgical consent,  Pre-op evaluation,  At surgeon's request and post-op pain management  Laterality: Left  Prep: chloraprep       Needles:  Injection technique: Single-shot  Needle Type: Echogenic Needle     Needle Length: 5cm 5 cm Needle Gauge: 22 and 22 G    Additional Needles:  Procedures: ultrasound guided (picture in chart) Interscalene brachial plexus block Narrative:  Injection made incrementally with aspirations every 5 mL.  Performed by: Personally  Anesthesiologist: Catalina Gravel  Additional Notes: Functioning IV was confirmed and monitors were applied.  A 55mm 22ga Arrow echogenic stimulator needle was used. Sterile prep and drape,hand hygiene and sterile gloves were used.  Negative aspiration and negative test dose prior to incremental administration of local anesthetic. The patient tolerated the procedure well.  Ultrasound guidance: relevent anatomy identified, needle position confirmed, local anesthetic spread visualized around nerve(s), vascular puncture avoided.  Image printed for medical record.    Procedure Name: Intubation Date/Time: 06/30/2015 1:29 PM Performed by: Lieutenant Diego Pre-anesthesia Checklist: Patient identified, Emergency Drugs available, Suction available and Patient being monitored Patient Re-evaluated:Patient Re-evaluated prior to inductionOxygen Delivery Method: Circle System Utilized Preoxygenation: Pre-oxygenation with 100% oxygen Intubation Type: IV induction Ventilation: Mask ventilation without difficulty Laryngoscope Size: Miller and 2 Grade View: Grade I Tube type: Oral Tube size: 8.0 mm Number of attempts: 1 Airway Equipment and Method: Stylet and Oral airway Placement  Confirmation: ETT inserted through vocal cords under direct vision,  positive ETCO2 and breath sounds checked- equal and bilateral Secured at: 22 cm Tube secured with: Tape Dental Injury: Teeth and Oropharynx as per pre-operative assessment

## 2015-06-30 NOTE — Op Note (Signed)
Procedure(s):   Carl Gardner male 59 y.o. 06/30/2015  Procedure(s) and Anesthesia Type:  #1 left shoulder arthroscopic rotator cuff repair  #2 left shoulder arthroscopic subacromial decompression  #3 left shoulder arthroscopic distal clavicle excision  #4 left shoulder arthroscopic debridement SLAP and anterior labral tear  Surgeon(s) and Role:    * Tania Ade, MD - Primary     Surgeon: Nita Sells   Assistants: Jeanmarie Hubert PA-C (Danielle was present and scrubbed throughout the procedure and was essential in positioning, assisting with the camera and instrumentation,, and closure)  Anesthesia: General endotracheal anesthesia with preoperative interscalene block given by the attending anesthesiologist    Procedure Detail  Estimated Blood Loss: Min         Drains: none  Blood Given: none         Specimens: none        Complications:  * No complications entered in OR log *         Disposition: PACU - hemodynamically stable.         Condition: stable    Procedure:   INDICATIONS FOR SURGERY: The patient is 59 y.o. male who has had a long history of left shoulder pain which has been refractory to nonoperative management. 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 After failing conservative management he was to go forward with surgical treatment to try and decrease pain and restore function. He understood risks benefits and alternatives to the procedure and wished to go forward with surgery.  OPERATIVE FINDINGS: Examination under anesthesia: no stiffness or instability. Diagnostic Arthroscopy:    DESCRIPTION OF PROCEDURE: The patient was identified in preoperative  holding area where I personally marked the operative site after  verifying site, side, and procedure with the patient. An interscalene block was given by the  attending anesthesiologist the holding area.  The patient was taken back to the operating room where general anesthesia was induced without complication and was placed in the beach-chair position with the back  elevated about 60 degrees and all extremities and head and neck carefully padded and  positioned.   The left upper extremity was then prepped and  draped in a standard sterile fashion. The appropriate time-out  procedure was carried out. The patient did receive IV antibiotics  within 30 minutes of incision.   A small posterior portal incision was made and the arthroscope was introduced into the joint. An anterior portal was then established above the subscapularis using needle localization. Small cannula was placed anteriorly. Diagnostic arthroscopy was then carried out.  Subscapularis was intact. He had fairly significant partial tearing of the anterior and superior labrum which was extensively debrided with shaver. The biceps origin was felt to be intact. Biceps tendon was pulled in the joint and was noted to be intact. Glenohumeral surfaces were intact without significant chondromalacia. No loose bodies noted. The anterior supraspinatus was noted to be partially torn from its origin on the greater tuberosity with about 80% of the tuberosity exposed just posterior to the biceps tendon. The width of the tear was only about 1 cm anterior to posterior. This was debrided from the undersurface.  The arthroscope was then introduced into the subacromial space a standard lateral portal was established with needle localization. The shaver was used through the lateral portal to perform extensive bursectomy. Coracoacromial ligament was examined and found to be severely frayed indicating chronic  impingement.  The bursal surface of the rotator cuff anteriorly was noted to be severely thin. A PDS suture had been passed percutaneously viewing from below and was noted to be in the area of severely thin  tendon. The shaver was used to gently debride down this outer edge which was easily penetrated creating a full-thickness defect connecting with the undersurface high-grade partial tear. Again tear measured about 1 cm anterior to posterior. A posterior lateral viewing portal was established. After debriding the tuberosity down to bleeding bone and debridingtendon edge the repair was carried out byplacing 1 4.75 swivel lock anchors just off the articular marginpreloaded with a fiber tape and #2 FiberWire. These 4 strands were passed evenly in a horizontal mattress configuration then brought over to an additional 4.75 swivel lock anchor in a lateral row bringing the tendon nicely down over the prepared tuberosity. The repair was felt to be even and watertight.  The coracoacromial ligament was taken down off the anterior acromion with the ArthroCare exposing a large hooked anterior acromial spur. A high-speed bur was then used through the lateral portal to take down the anterior acromial spur from lateral to medial in a standard acromioplasty.  The acromioplasty was also viewed from the lateral portal and the bur was used as necessary to ensure that the acromion was completely flat from posterior to anterior.  The distal clavicle was exposed arthroscopically and the bur was used to take off the undersurface for approximately 8 mm from the lateral portal. The bur was then moved to an anterior portal position to complete the distal clavicle excision resecting about 8 mm of the distal clavicle and a smooth even fashion. This was viewed from anterior and lateral portals and felt to be complete.  The arthroscopic equipment was removed from the joint and the portals were closed with 3-0 nylon in an interrupted fashion. Sterile dressings were then applied including Xeroform 4 x 4's ABDs and tape. The patient was then allowed to awaken from general anesthesia, placed in a sling, transferred to the stretcher and taken to  the recovery room in stable condition.   POSTOPERATIVE PLAN: The patient will be discharged home today and will followup in one week for suture removal and wound check.  He will follow the standard cuff protocol.

## 2015-06-30 NOTE — H&P (Signed)
Carl Gardner is an 59 y.o. male.   Chief Complaint: Left shoulder pain and dysfunction HPI: 59 year old male with long history of left shoulder pain which has failed conservative management. He has had over one year if symptoms. MRI showed partial-thickness rotator cuff tear. 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  Past Medical History  Diagnosis Date  . Hepatitis     Hep C  . BPH (benign prostatic hypertrophy)     Past Surgical History  Procedure Laterality Date  . Wrist surgery Left     History reviewed. No pertinent family history. Social History:  reports that he has quit smoking. He does not have any smokeless tobacco history on file. He reports that he uses illicit drugs (Marijuana). He reports that he does not drink alcohol.  Allergies:  Allergies  Allergen Reactions  . Penicillins Hives    Medications Prior to Admission  Medication Sig Dispense Refill  . tamsulosin (FLOMAX) 0.4 MG CAPS capsule Take 0.4 mg by mouth.      No results found for this or any previous visit (from the past 48 hour(s)). No results found.  Review of Systems  All other systems reviewed and are negative.   Blood pressure 119/80, pulse 51, temperature 97.8 F (36.6 C), temperature source Oral, resp. rate 8, height 5' 8.5" (1.74 m), weight 98.431 kg (217 lb), SpO2 100 %. Physical Exam  Constitutional: He is oriented to person, place, and time. He appears well-developed and well-nourished.  HENT:  Head: Atraumatic.  Eyes: EOM are normal.  Cardiovascular: Intact distal pulses.   Respiratory: Effort normal.  Musculoskeletal:  Left shoulder tenderness over the ac joint and supraspinatus insertion.  Neurological: He is alert and oriented to person, place, and time.  Skin: Skin is warm and dry.  Psychiatric: He has a normal mood and affect.     Assessment/Plan Left shoulder impingement with unfavorable acromial anatomy, acromioclavicular disease,  failed conservative management. Plan left shoulder arthroscopic debridement versus repair the rotator cuff with subacromial decompression and distal clavicle excision. Risks / benefits of surgery discussed Consent on chart  NPO for OR Preop antibiotics   Carl Gardner 06/30/2015, 1:09 PM

## 2015-07-01 ENCOUNTER — Encounter (HOSPITAL_BASED_OUTPATIENT_CLINIC_OR_DEPARTMENT_OTHER): Payer: Self-pay | Admitting: Orthopedic Surgery

## 2015-07-01 LAB — POCT HEMOGLOBIN-HEMACUE: HEMOGLOBIN: 15.4 g/dL (ref 13.0–17.0)

## 2015-07-04 NOTE — Anesthesia Postprocedure Evaluation (Signed)
  Anesthesia Post-op Note  Patient: Carl Gardner  Procedure(s) Performed: Procedure(s) (LRB): SHOULDER ARTHROSCOPY WITH ARTHROSCOPIC ROTATOR CUFF REPAIR, SUBACROMIAL DECOMPRESSION (Left) SHOULDER ARTHROSCOPY WITH DISTAL CLAVICLE RESECTION; LABRAL TEAR DEBRIDEMENT (Left)  Patient Location: PACU  Anesthesia Type: GA combined with regional for post-op pain  Level of Consciousness: awake and alert   Airway and Oxygen Therapy: Patient Spontanous Breathing  Post-op Pain: mild  Post-op Assessment: Post-op Vital signs reviewed, Patient's Cardiovascular Status Stable, Respiratory Function Stable, Patent Airway and No signs of Nausea or vomiting  Last Vitals:  Filed Vitals:   06/30/15 1623  BP: 141/80  Pulse: 60  Temp: 36.5 C  Resp: 16    Post-op Vital Signs: stable   Complications: No apparent anesthesia complications

## 2016-08-12 ENCOUNTER — Encounter: Payer: Self-pay | Admitting: Family

## 2016-08-12 ENCOUNTER — Ambulatory Visit (INDEPENDENT_AMBULATORY_CARE_PROVIDER_SITE_OTHER): Payer: BLUE CROSS/BLUE SHIELD | Admitting: Family

## 2016-08-12 VITALS — BP 120/68 | HR 67 | Ht 68.5 in | Wt 240.0 lb

## 2016-08-12 DIAGNOSIS — Z23 Encounter for immunization: Secondary | ICD-10-CM | POA: Diagnosis not present

## 2016-08-12 DIAGNOSIS — R10811 Right upper quadrant abdominal tenderness: Secondary | ICD-10-CM | POA: Insufficient documentation

## 2016-08-12 DIAGNOSIS — Z0001 Encounter for general adult medical examination with abnormal findings: Secondary | ICD-10-CM | POA: Insufficient documentation

## 2016-08-12 NOTE — Progress Notes (Signed)
Subjective:    Patient ID: Carl Gardner, male    DOB: [birth year], 60 y.o.   MRN: [id number]  Chief Complaint  Patient presents with  . Establish Care    HPI:  Carl Gardner is a 59 y.o. male who presents today for an annual wellness visit.   1) Health Maintenance -   Diet - Averages 1-2 meals per day consisting of a regular diet; No caffeine intake  Exercise - Walks the dog daily   2) Preventative Exams / Immunizations:  Dental -- Due for exam  Vision -- Up to date   Health Maintenance  Topic Date Due  . HIV Screening  02/12/1972  . COLONOSCOPY  02/12/2007  . TETANUS/TDAP  08/12/2026  . INFLUENZA VACCINE  Completed  . Hepatitis C Screening  Completed    Immunization History  Administered Date(s) Administered  . Influenza,inj,Quad PF,36+ Mos 08/12/2016  . Tdap 08/12/2016     Allergies  Allergen Reactions  . Penicillins Hives     Outpatient Medications Prior to Visit  Medication Sig Dispense Refill  . docusate sodium (COLACE) 100 MG capsule Take 1 capsule (100 mg total) by mouth 3 (three) times daily as needed. (Patient not taking: Reported on 08/12/2016) 20 capsule 0  . oxyCODONE-acetaminophen (ROXICET) 5-325 MG per tablet Take 1-2 tablets by mouth every 4 (four) hours as needed for severe pain. (Patient not taking: Reported on 08/12/2016) 60 tablet 0  . tamsulosin (FLOMAX) 0.4 MG CAPS capsule Take 0.4 mg by mouth.     No facility-administered medications prior to visit.      Past Medical History:  Diagnosis Date  . BPH (benign prostatic hypertrophy)   . Hepatitis    Hep C  . History of chicken pox      Past Surgical History:  Procedure Laterality Date  . SHOULDER ARTHROSCOPY WITH DISTAL CLAVICLE RESECTION Left 06/30/2015   Procedure: SHOULDER ARTHROSCOPY WITH DISTAL CLAVICLE RESECTION; LABRAL TEAR DEBRIDEMENT;  Surgeon: Tania Ade, MD;  Location: [facility];  Service: Orthopedics;  Laterality: Left;  . SHOULDER  ARTHROSCOPY WITH ROTATOR CUFF REPAIR AND SUBACROMIAL DECOMPRESSION Left 06/30/2015   Procedure: SHOULDER ARTHROSCOPY WITH ARTHROSCOPIC ROTATOR CUFF REPAIR, SUBACROMIAL DECOMPRESSION;  Surgeon: Tania Ade, MD;  Location: [facility];  Service: Orthopedics;  Laterality: Left;  . WRIST SURGERY Left      Family History  Problem Relation Age of Onset  . Adopted: Yes     Social History   Social History  . Marital status: Married    Spouse name: N/A  . Number of children: 2  . Years of education: 12   Occupational History  . Caregiver    Social History Main Topics  . Smoking status: Never Smoker  . Smokeless tobacco: Never Used  . Alcohol use No     Comment: last use friday 1200  . Drug use:     Frequency: 3.0 times per week    Types: Marijuana  . Sexual activity: Not on file   Other Topics Concern  . Not on file   Social History Narrative   Fun: Take care of mom      Review of Systems  Constitutional: Denies fever, chills, fatigue, or significant weight gain/loss. HENT: Head: Denies headache or neck pain Ears: Denies changes in hearing, ringing in ears, earache, drainage Nose: Denies discharge, stuffiness, itching, nosebleed, sinus pain Throat: Denies sore throat, hoarseness, dry mouth, sores, thrush Eyes: Denies loss/changes in vision, pain, redness, blurry/double vision, flashing  lights Cardiovascular: Denies chest pain/discomfort, tightness, palpitations, shortness of breath with activity, difficulty lying down, swelling, sudden awakening with shortness of breath Respiratory: Denies shortness of breath, cough, sputum production, wheezing Gastrointestinal: Denies dysphasia, heartburn, change in appetite, nausea, change in bowel habits, rectal bleeding, constipation, diarrhea, yellow skin or eyes Genitourinary: Denies frequency, urgency, burning/pain, blood in urine, incontinence, change in urinary strength. Musculoskeletal: Denies muscle/joint pain,  stiffness, back pain, redness or swelling of joints, trauma Skin: Denies rashes, lumps, itching, dryness, color changes, or hair/nail changes Neurological: Denies dizziness, fainting, seizures, weakness, numbness, tingling, tremor Psychiatric - Denies nervousness, stress, depression or memory loss Endocrine: Denies heat or cold intolerance, sweating, frequent urination, excessive thirst, changes in appetite Hematologic: Denies ease of bruising or bleeding     Objective:     BP 120/68   Pulse 67   Ht 5' 8.5" (1.74 m)   Wt 240 lb (108.9 kg)   SpO2 95%   BMI 35.96 kg/m  Nursing note and vital signs reviewed.  Physical Exam  Constitutional: He is oriented to person, place, and time. He appears well-developed and well-nourished.  HENT:  Head: Normocephalic.  Right Ear: Hearing, tympanic membrane, external ear and ear canal normal.  Left Ear: Hearing, tympanic membrane, external ear and ear canal normal.  Nose: Nose normal.  Mouth/Throat: Uvula is midline, oropharynx is clear and moist and mucous membranes are normal.  Eyes: Conjunctivae and EOM are normal. Pupils are equal, round, and reactive to light.  Neck: Neck supple. No JVD present. No tracheal deviation present. No thyromegaly present.  Cardiovascular: Normal rate, regular rhythm, normal heart sounds and intact distal pulses.   Pulmonary/Chest: Effort normal and breath sounds normal.  Abdominal: Soft. Bowel sounds are normal. He exhibits no distension and no mass. There is hepatomegaly. There is tenderness in the right upper quadrant. There is no rigidity, no rebound, no guarding, no tenderness at McBurney's point and negative Murphy's sign.  Musculoskeletal: Normal range of motion. He exhibits no edema or tenderness.  Lymphadenopathy:    He has no cervical adenopathy.  Neurological: He is alert and oriented to person, place, and time. He has normal reflexes. No cranial nerve deficit. He exhibits normal muscle tone. Coordination  normal.  Skin: Skin is warm and dry.  Psychiatric: He has a normal mood and affect. His behavior is normal. Judgment and thought content normal.       Assessment & Plan:   Problem List Items Addressed This Visit      Other   Encounter for routine adult health examination with abnormal findings - Primary    1) Anticipatory Guidance: Discussed importance of wearing a seatbelt while driving and not texting while driving; changing batteries in smoke detector at least once annually; wearing suntan lotion when outside; eating a balanced and moderate diet; getting physical activity at least 30 minutes per day.  2) Immunizations / Screenings / Labs:  Tetanus and influenza updated today. All other immunizations are up-to-date per recommendations. Due for a dental exam encouraged to be completed independently. Obtain PSA for prostate cancer screening. Due for colon cancer screening with referral to gastroenterology placed. Obtain hepatitis C antibody for hepatitis C screening. All other screenings are up-to-date per recommendations. Obtain CBC, CMET, and lipid profile.    Overall well exam with risk factors for cardiovascular disease including obesity. Recommend weight loss of 5-10% of current body weight and nutrition and physical activity. Maintained on saw palmetto to help with symptoms of BPH. Continue other healthy lifestyle  behaviors and choices. Follow-up prevention exam in 1 year. Follow-up office visit for chronic conditions pending lab work if necessary.       Relevant Medications   saw palmetto 160 MG capsule   Other Relevant Orders   Ambulatory referral to Gastroenterology   CBC   Comprehensive metabolic panel   Lipid panel   PSA   Hepatitis C antibody   Right upper quadrant abdominal tenderness without rebound tenderness    Right upper quadrant tenderness noted upon physical exam with concern for possible hepatic pathology. Obtain hepatitis C and comprehensive metabolic panel.. May  require ultrasound and additional hepatitis testing if indicated.       Other Visit Diagnoses    Need for Tdap vaccination       Relevant Orders   Tdap vaccine greater than or equal to 7yo IM (Completed)   Need for influenza vaccination       Relevant Orders   Flu Vaccine QUAD 36+ mos IM (Completed)       I have discontinued Mr. Robb's tamsulosin, oxyCODONE-acetaminophen, and docusate sodium. I am also having him maintain his saw palmetto.   Meds ordered this encounter  Medications  . saw palmetto 160 MG capsule    Sig: Take 160 mg by mouth 2 (two) times daily.     Follow-up: Return in about 3 months (around 11/12/2016), or if symptoms worsen or fail to improve.   Mauricio Po, FNP

## 2016-08-12 NOTE — Progress Notes (Signed)
Pre visit review using our clinic review tool, if applicable. No additional management support is needed unless otherwise documented below in the visit note. 

## 2016-08-12 NOTE — Assessment & Plan Note (Signed)
Right upper quadrant tenderness noted upon physical exam with concern for possible hepatic pathology. Obtain hepatitis C and comprehensive metabolic panel.. May require ultrasound and additional hepatitis testing if indicated.

## 2016-08-12 NOTE — Assessment & Plan Note (Signed)
1) Anticipatory Guidance: Discussed importance of wearing a seatbelt while driving and not texting while driving; changing batteries in smoke detector at least once annually; wearing suntan lotion when outside; eating a balanced and moderate diet; getting physical activity at least 30 minutes per day.  2) Immunizations / Screenings / Labs:  Tetanus and influenza updated today. All other immunizations are up-to-date per recommendations. Due for a dental exam encouraged to be completed independently. Obtain PSA for prostate cancer screening. Due for colon cancer screening with referral to gastroenterology placed. Obtain hepatitis C antibody for hepatitis C screening. All other screenings are up-to-date per recommendations. Obtain CBC, CMET, and lipid profile.    Overall well exam with risk factors for cardiovascular disease including obesity. Recommend weight loss of 5-10% of current body weight and nutrition and physical activity. Maintained on saw palmetto to help with symptoms of BPH. Continue other healthy lifestyle behaviors and choices. Follow-up prevention exam in 1 year. Follow-up office visit for chronic conditions pending lab work if necessary.

## 2016-08-12 NOTE — Patient Instructions (Signed)
Thank you for choosing [facility].  SUMMARY AND INSTRUCTIONS:   Labs:  Please stop by the lab on the lower level of the building for your blood work. Your results will be released to [place] (or called to you) after review, usually within 72 hours after test completion. If any changes need to be made, you will be notified at that same time.  1.) The lab is open from 7:30am to 5:30 pm Monday-Friday 2.) No appointment is necessary 3.) Fasting (if needed) is 6-8 hours after food and drink; black coffee and water are okay   Follow up:  If your symptoms worsen or fail to improve, please contact our office for further instruction, or in case of emergency go directly to the emergency room at the closest medical facility.   Health Maintenance, Male A healthy lifestyle and preventative care can promote health and wellness.  Maintain regular health, dental, and eye exams.  Eat a healthy diet. Foods like vegetables, fruits, whole grains, low-fat dairy products, and lean protein foods contain the nutrients you need and are low in calories. Decrease your intake of foods high in solid fats, added sugars, and salt. Get information about a proper diet from your health care provider, if necessary.  Regular physical exercise is one of the most important things you can do for your health. Most adults should get at least 150 minutes of moderate-intensity exercise (any activity that increases your heart rate and causes you to sweat) each week. In addition, most adults need muscle-strengthening exercises on 2 or more days a week.   Maintain a healthy weight. The body mass index (BMI) is a screening tool to identify possible weight problems. It provides an estimate of body fat based on height and weight. Your health care provider can find your BMI and can help you achieve or maintain a healthy weight. For males 20 years and older:  A BMI below 18.5 is considered underweight.  A BMI of 18.5 to 24.9 is  normal.  A BMI of 25 to 29.9 is considered overweight.  A BMI of 30 and above is considered obese.  Maintain normal blood lipids and cholesterol by exercising and minimizing your intake of saturated fat. Eat a balanced diet with plenty of fruits and vegetables. Blood tests for lipids and cholesterol should begin at age 70 and be repeated every 5 years. If your lipid or cholesterol levels are high, you are over age 70, or you are at high risk for heart disease, you may need your cholesterol levels checked more frequently.Ongoing high lipid and cholesterol levels should be treated with medicines if diet and exercise are not working.  If you smoke, find out from your health care provider how to quit. If you do not use tobacco, do not start.  Lung cancer screening is recommended for adults aged 30-80 years who are at high risk for developing lung cancer because of a history of smoking. A yearly low-dose CT scan of the lungs is recommended for people who have at least a 30-pack-year history of smoking and are current smokers or have quit within the past 15 years. A pack year of smoking is smoking an average of 1 pack of cigarettes a day for 1 year (for example, a 30-pack-year history of smoking could mean smoking 1 pack a day for 30 years or 2 packs a day for 15 years). Yearly screening should continue until the smoker has stopped smoking for at least 15 years. Yearly screening should be stopped  for people who develop a health problem that would prevent them from having lung cancer treatment.  If you choose to drink alcohol, do not have more than 2 drinks per day. One drink is considered to be 12 oz (360 mL) of beer, 5 oz (150 mL) of wine, or 1.5 oz (45 mL) of liquor.  Avoid the use of street drugs. Do not share needles with anyone. Ask for help if you need support or instructions about stopping the use of drugs.  High blood pressure causes heart disease and increases the risk of stroke. High blood  pressure is more likely to develop in:  People who have blood pressure in the end of the normal range (100-139/85-89 mm Hg).  People who are overweight or obese.  People who are African American.  If you are 8-87 years of age, have your blood pressure checked every 3-5 years. If you are 35 years of age or older, have your blood pressure checked every year. You should have your blood pressure measured twice--once when you are at a hospital or clinic, and once when you are not at a hospital or clinic. Record the average of the two measurements. To check your blood pressure when you are not at a hospital or clinic, you can use:  An automated blood pressure machine at a pharmacy.  A home blood pressure monitor.  If you are 34-30 years old, ask your health care provider if you should take aspirin to prevent heart disease.  Diabetes screening involves taking a blood sample to check your fasting blood sugar level. This should be done once every 3 years after age 71 if you are at a normal weight and without risk factors for diabetes. Testing should be considered at a younger age or be carried out more frequently if you are overweight and have at least 1 risk factor for diabetes.  Colorectal cancer can be detected and often prevented. Most routine colorectal cancer screening begins at the age of 33 and continues through age 23. However, your health care provider may recommend screening at an earlier age if you have risk factors for colon cancer. On a yearly basis, your health care provider may provide home test kits to check for hidden blood in the stool. A small camera at the end of a tube may be used to directly examine the colon (sigmoidoscopy or colonoscopy) to detect the earliest forms of colorectal cancer. Talk to your health care provider about this at age 48 when routine screening begins. A direct exam of the colon should be repeated every 5-10 years through age 72, unless early forms of  precancerous polyps or small growths are found.  People who are at an increased risk for hepatitis B should be screened for this virus. You are considered at high risk for hepatitis B if:  You were born in a country where hepatitis B occurs often. Talk with your health care provider about which countries are considered high risk.  Your parents were born in a high-risk country and you have not received a shot to protect against hepatitis B (hepatitis B vaccine).  You have HIV or AIDS.  You use needles to inject street drugs.  You live with, or have sex with, someone who has hepatitis B.  You are a man who has sex with other men (MSM).  You get hemodialysis treatment.  You take certain medicines for conditions like cancer, organ transplantation, and autoimmune conditions.  Hepatitis C blood testing is recommended for  all people born from [birth year] through 1964 and any individual with known risk factors for hepatitis C.  Healthy men should no longer receive prostate-specific antigen (PSA) blood tests as part of routine cancer screening. Talk to your health care provider about prostate cancer screening.  Testicular cancer screening is not recommended for adolescents or adult males who have no symptoms. Screening includes self-exam, a health care provider exam, and other screening tests. Consult with your health care provider about any symptoms you have or any concerns you have about testicular cancer.  Practice safe sex. Use condoms and avoid high-risk sexual practices to reduce the spread of sexually transmitted infections (STIs).  You should be screened for STIs, including gonorrhea and chlamydia if:  You are sexually active and are younger than 24 years.  You are older than 24 years, and your health care provider tells you that you are at risk for this type of infection.  Your sexual activity has changed since you were last screened, and you are at an increased risk for chlamydia or  gonorrhea. Ask your health care provider if you are at risk.  If you are at risk of being infected with HIV, it is recommended that you take a prescription medicine daily to prevent HIV infection. This is called pre-exposure prophylaxis (PrEP). You are considered at risk if:  You are a man who has sex with other men (MSM).  You are a heterosexual man who is sexually active with multiple partners.  You take drugs by injection.  You are sexually active with a partner who has HIV.  Talk with your health care provider about whether you are at high risk of being infected with HIV. If you choose to begin PrEP, you should first be tested for HIV. You should then be tested every 3 months for as long as you are taking PrEP.  Use sunscreen. Apply sunscreen liberally and repeatedly throughout the day. You should seek shade when your shadow is shorter than you. Protect yourself by wearing long sleeves, pants, a wide-brimmed hat, and sunglasses year round whenever you are outdoors.  Tell your health care provider of new moles or changes in moles, especially if there is a change in shape or color. Also, tell your health care provider if a mole is larger than the size of a pencil eraser.  A one-time screening for abdominal aortic aneurysm (AAA) and surgical repair of large AAAs by ultrasound is recommended for men aged 71-75 years who are current or former smokers.  Stay current with your vaccines (immunizations).   This information is not intended to replace advice given to you by your health care provider. Make sure you discuss any questions you have with your health care provider.   Document Released: 04/01/2008 Document Revised: 10/25/2014 Document Reviewed: 03/01/2011 Elsevier Interactive Patient Education [employer].

## 2016-08-13 ENCOUNTER — Other Ambulatory Visit (INDEPENDENT_AMBULATORY_CARE_PROVIDER_SITE_OTHER): Payer: BLUE CROSS/BLUE SHIELD

## 2016-08-13 DIAGNOSIS — Z0001 Encounter for general adult medical examination with abnormal findings: Secondary | ICD-10-CM | POA: Diagnosis not present

## 2016-08-13 LAB — COMPREHENSIVE METABOLIC PANEL
ALBUMIN: 4.4 g/dL (ref 3.5–5.2)
ALT: 30 U/L (ref 0–53)
AST: 27 U/L (ref 0–37)
Alkaline Phosphatase: 87 U/L (ref 39–117)
BUN: 19 mg/dL (ref 6–23)
CHLORIDE: 105 meq/L (ref 96–112)
CO2: 28 meq/L (ref 19–32)
CREATININE: 1.21 mg/dL (ref 0.40–1.50)
Calcium: 9.1 mg/dL (ref 8.4–10.5)
GFR: 78.8 mL/min (ref >60.00)
GLUCOSE: 92 mg/dL (ref 70–99)
POTASSIUM: 4.4 meq/L (ref 3.5–5.1)
SODIUM: 140 meq/L (ref 135–145)
Total Bilirubin: 0.8 mg/dL (ref 0.2–1.2)
Total Protein: 7.5 g/dL (ref 6.0–8.3)

## 2016-08-13 LAB — LIPID PANEL
CHOL/HDL RATIO: 4
Cholesterol: 178 mg/dL (ref 0–200)
HDL: 41.8 mg/dL (ref >39.00)
LDL CALC: 118 mg/dL — AB (ref 0–99)
NonHDL: 136.5
Triglycerides: 95 mg/dL (ref 0.0–149.0)
VLDL: 19 mg/dL (ref 0.0–40.0)

## 2016-08-13 LAB — CBC
HEMATOCRIT: 44.6 % (ref 39.0–52.0)
Hemoglobin: 15.2 g/dL (ref 13.0–17.0)
MCHC: 34.1 g/dL (ref 30.0–36.0)
MCV: 100.1 fl — AB (ref 78.0–100.0)
Platelets: 199 10*3/uL (ref 150.0–400.0)
RBC: 4.45 Mil/uL (ref 4.22–5.81)
RDW: 13.7 % (ref 11.5–15.5)
WBC: 7.2 10*3/uL (ref 4.0–10.5)

## 2016-08-14 LAB — HEPATITIS C ANTIBODY: HCV Ab: REACTIVE — AB

## 2016-08-16 ENCOUNTER — Other Ambulatory Visit: Payer: Self-pay | Admitting: Family

## 2016-08-16 DIAGNOSIS — R768 Other specified abnormal immunological findings in serum: Secondary | ICD-10-CM | POA: Insufficient documentation

## 2016-08-16 DIAGNOSIS — R7689 Other specified abnormal immunological findings in serum: Secondary | ICD-10-CM | POA: Insufficient documentation

## 2016-08-16 LAB — PSA: PSA: 0.48 ng/mL (ref 0.10–4.00)

## 2016-08-17 ENCOUNTER — Encounter: Payer: Self-pay | Admitting: Gastroenterology

## 2016-08-17 LAB — HEPATITIS C RNA QUANTITATIVE
HCV QUANT: 652914 [IU]/mL — AB (ref <15)
HCV Quantitative Log: 5.81 {Log} — ABNORMAL HIGH (ref <1.18)

## 2016-09-22 ENCOUNTER — Ambulatory Visit (INDEPENDENT_AMBULATORY_CARE_PROVIDER_SITE_OTHER): Payer: BLUE CROSS/BLUE SHIELD | Admitting: Internal Medicine

## 2016-09-22 ENCOUNTER — Encounter: Payer: Self-pay | Admitting: Internal Medicine

## 2016-09-22 DIAGNOSIS — B182 Chronic viral hepatitis C: Secondary | ICD-10-CM | POA: Diagnosis not present

## 2016-09-22 DIAGNOSIS — Z23 Encounter for immunization: Secondary | ICD-10-CM | POA: Diagnosis not present

## 2016-09-22 LAB — COMPLETE METABOLIC PANEL WITH GFR
ALT: 32 U/L (ref 9–46)
AST: 25 U/L (ref 10–35)
Albumin: 4.1 g/dL (ref 3.6–5.1)
Alkaline Phosphatase: 79 U/L (ref 40–115)
BUN: 15 mg/dL (ref 7–25)
CHLORIDE: 107 mmol/L (ref 98–110)
CO2: 23 mmol/L (ref 20–31)
CREATININE: 1.08 mg/dL (ref 0.70–1.33)
Calcium: 8.7 mg/dL (ref 8.6–10.3)
GFR, Est African American: 86 mL/min (ref >=60)
GFR, Est Non African American: 75 mL/min (ref >=60)
Glucose, Bld: 99 mg/dL (ref 65–99)
POTASSIUM: 4.4 mmol/L (ref 3.5–5.3)
Sodium: 139 mmol/L (ref 135–146)
Total Bilirubin: 0.3 mg/dL (ref 0.2–1.2)
Total Protein: 7.1 g/dL (ref 6.1–8.1)

## 2016-09-22 LAB — CBC WITH DIFFERENTIAL/PLATELET
BASOS PCT: 0 %
Basophils Absolute: 0 cells/uL (ref 0–200)
EOS ABS: 57 {cells}/uL (ref 15–500)
EOS PCT: 1 %
HCT: 45 % (ref 38.5–50.0)
Hemoglobin: 15 g/dL (ref 13.2–17.1)
LYMPHS PCT: 48 %
Lymphs Abs: 2736 cells/uL (ref 850–3900)
MCH: 34.3 pg — AB (ref 27.0–33.0)
MCHC: 33.3 g/dL (ref 32.0–36.0)
MCV: 103 fL — AB (ref 80.0–100.0)
MONOS PCT: 9 %
MPV: 10.2 fL (ref 7.5–12.5)
Monocytes Absolute: 513 cells/uL (ref 200–950)
NEUTROS ABS: 2394 {cells}/uL (ref 1500–7800)
Neutrophils Relative %: 42 %
PLATELETS: 186 10*3/uL (ref 140–400)
RBC: 4.37 MIL/uL (ref 4.20–5.80)
RDW: 13.2 % (ref 11.0–15.0)
WBC: 5.7 10*3/uL (ref 3.8–10.8)

## 2016-09-22 NOTE — Patient Instructions (Signed)
Date 09/22/16  Dear Mr. Lozinski, As discussed in the [facility], your hepatitis C therapy will include the following medications:          Harvoni 90mg /400mg  tablet or equivalent:           Take 1 tablet by mouth once daily   Please note that ALL MEDICATIONS WILL START ON THE SAME DATE for a total of 8 or 12 weeks. ---------------------------------------------------------------- Your HCV Treatment Start Date: TBA   Your HCV genotype:  unknown    Liver Fibrosis: TBD    ---------------------------------------------------------------- YOUR PHARMACY CONTACT:   [place] [facility] of [facility] and [place] Phone: [phone] Hours: Monday to Friday 7:30 am to 6:00 pm   Please always contact your pharmacy at least 3-4 business days before you run out of medications to ensure your next month's medication is ready or 1 week prior to running out if you receive it by mail.  Remember, each prescription is for 28 days. ---------------------------------------------------------------- GENERAL NOTES REGARDING YOUR HEPATITIS C MEDICATION:  SOFOSBUVIR/LEDIPASVIR (HARVONI): - Harvoni tablet is taken daily with OR without food. - The tablets are orange. - The tablets should be stored at room temperature.  - Acid reducing agents such as H2 blockers (ie. Pepcid (famotidine), Zantac (ranitidine), Tagamet (cimetidine), Axid (nizatidine) and proton pump inhibitors (ie. Prilosec (omeprazole), Protonix (pantoprazole), Nexium (esomeprazole), or Aciphex (rabeprazole)) can decrease effectiveness of Harvoni. Do not take until you have discussed with a health care provider.    -Antacids that contain magnesium and/or aluminum hydroxide (ie. Milk of Magensia, Rolaids, Gaviscon, Maalox, Mylanta, an dArthritis Pain Formula)can reduce absorption of Harvoni, so take them at least 4 hours before or after Harvoni.  -Calcium carbonate (calcium supplements or antacids  such as Tums, Caltrate, Os-Cal)needs to be taken at least 4 hours hours before or after Harvoni.  -St. John's wort or any products that contain St. John's wort like some herbal supplements  Please inform the office prior to starting any of these medications.  - The common side effects associated with Harvoni include:      1. Fatigue      2. Headache      3. Nausea      4. Diarrhea      5. Insomnia  Please note that this only lists the most common side effects and is NOT a comprehensive list of the potential side effects of these medications. For more information, please review the drug information sheets that come with your medication package from the pharmacy.  ---------------------------------------------------------------- GENERAL HELPFUL HINTS ON HCV THERAPY: 1. Stay well-hydrated. 2. Notify the [facility] of any changes in your other over-the-counter/herbal or prescription medications. 3. If you miss a dose of your medication, take the missed dose as soon as you remember. Return to your regular time/dose schedule the next day.  4.  Do not stop taking your medications without first talking with your healthcare provider. 5.  You may take Tylenol (acetaminophen), as long as the dose is less than 2000 mg (OR no more than 4 tablets of the Tylenol Extra Strengths 500mg  tablet) in 24 hours. 6.  You will see our pharmacist-specialist within the first 2 weeks of starting your medication to monitor for any possible side effects. 7.  You will have labs once during treatment, after soon after treatment completion and one final lab 6 months after treatment completion to verify the virus is out of your system.  Scharlene Gloss, MD  [facility] [address] [place], [place]  [phone]

## 2016-09-22 NOTE — Addendum Note (Signed)
Addended by: Janyce Llanos F on: 09/22/2016 11:19 AM   Modules accepted: Orders

## 2016-09-22 NOTE — Progress Notes (Signed)
[facility]   CC: consideration for treatment for chronic hepatitis C  HPI:  +Carl Gardner is a 60 y.o. male who presents for initial evaluation and management of chronic hepatitis C.  Patient tested positive several years ago but did not seek treatment. Hepatitis C-associated risk factors present are: none. Patient denies history of blood transfusion, IV drug abuse, multiple sexual partners, renal dialysis, sexual contact with person with liver disease, tattoos. Patient has had other studies performed. Results: hepatitis C RNA by PCR, result: positive. Patient has not had prior treatment for Hepatitis C. Patient does not have a past history of liver disease. Patient does not have a family history of liver disease (adopted). Patient does not  have associated signs or symptoms related to liver disease.  Labs reviewed and confirm chronic hepatitis C with a positive viral load.   Records reviewed from pcp.  New visit and known to be positive.  RNA noted.        Patient does not have documented immunity to Hepatitis A. Patient does not have documented immunity to Hepatitis B.    Review of Systems:   Constitutional: negative for fatigue, malaise and anorexia Gastrointestinal: negative for diarrhea Integument/breast: negative for rash Musculoskeletal: negative for myalgias and arthralgias All other systems reviewed and are negative       Past Medical History:  Diagnosis Date  . BPH (benign prostatic hypertrophy)   . Hepatitis    Hep C  . History of chicken pox     Prior to Admission medications   Medication Sig Start Date End Date Taking? Authorizing Provider  saw palmetto 160 MG capsule Take 160 mg by mouth 2 (two) times daily.    Historical Provider, MD    Allergies  Allergen Reactions  . Penicillins Hives    Social History  Substance Use Topics  . Smoking status: Never Smoker  . Smokeless tobacco: Never Used  . Alcohol use No   Comment: last use friday 1200    Family History  Problem Relation Age of Onset  . Adopted: Yes     Objective:  Constitutional: in no apparent distress Vitals:   09/22/16 0924  BP: (!) 155/85  Pulse: 66  Temp: 97.6 F (36.4 C)   Eyes: anicteric Cardiovascular: Cor RRR and No murmurs Respiratory: CTA B; normal respiratory effort Gastrointestinal: Bowel sounds are normal, liver is not enlarged, spleen is not enlarged Musculoskeletal: no pedal edema noted Skin: negatives: no rash; no porphyria cutanea tarda Lymphatic: no cervical lymphadenopathy   Laboratory Genotype:  HCV viral load:  Lab Results  Component Value Date   HCVQUANT 652,914 (H) 08/13/2016   Lab Results  Component Value Date   WBC 7.2 08/13/2016   HGB 15.2 08/13/2016   HCT 44.6 08/13/2016   MCV 100.1 (H) 08/13/2016   PLT 199.0 08/13/2016    Lab Results  Component Value Date   CREATININE 1.21 08/13/2016   BUN 19 08/13/2016   NA 140 08/13/2016   K 4.4 08/13/2016   CL 105 08/13/2016   CO2 28 08/13/2016    Lab Results  Component Value Date   ALT 30 08/13/2016   AST 27 08/13/2016   ALKPHOS 87 08/13/2016     Labs and history reviewed and show CHILD-PUGH unknown  5-6 points: Child class A 7-9 points: Child class B 10-15 points: Child class C  Lab Results  Component Value Date   BILITOT 0.8 08/13/2016   ALBUMIN 4.4 08/13/2016  Assessment: New Patient with Chronic Hepatitis C genotype unknown, untreated.  I discussed with the patient the lab findings that confirm chronic hepatitis C as well as the natural history and progression of disease including about 30% of people who develop cirrhosis of the liver if left untreated and once cirrhosis is established there is a 2-7% risk per year of liver cancer and liver failure.  I discussed the importance of treatment and benefits in reducing the risk, even if significant liver fibrosis exists.   Plan: 1) Patient counseled extensively on limiting  acetaminophen to no more than 2 grams daily, avoidance of alcohol. 2) Transmission discussed with patient including sexual transmission, sharing razors and toothbrush.   3) Will need referral to gastroenterology if concern for cirrhosis 4) Will need referral for substance abuse counseling: No.; Further work up to include urine drug screen  No. 5) Will prescribe Harvoni for 12 weeks 6) Hepatitis A and B titers 8) Pneumovax vaccine  9) Further work up to include liver staging with elastography 10) will follow up after starting medication

## 2016-09-23 LAB — PROTIME-INR
INR: 0.9
PROTHROMBIN TIME: 10 s (ref 9.0–11.5)

## 2016-09-23 LAB — HIV ANTIBODY (ROUTINE TESTING W REFLEX): HIV: NONREACTIVE

## 2016-09-23 LAB — HEPATITIS A ANTIBODY, TOTAL: Hep A Total Ab: REACTIVE — AB

## 2016-09-23 LAB — HEPATITIS B SURFACE ANTIGEN: Hepatitis B Surface Ag: NEGATIVE

## 2016-09-23 LAB — HEPATITIS B CORE ANTIBODY, TOTAL: Hep B Core Total Ab: NONREACTIVE

## 2016-09-23 LAB — HEPATITIS B SURFACE ANTIBODY,QUALITATIVE: HEP B S AB: NEGATIVE

## 2016-09-27 ENCOUNTER — Other Ambulatory Visit: Payer: Self-pay | Admitting: Internal Medicine

## 2016-09-27 LAB — HEPATITIS C GENOTYPE

## 2016-09-27 MED ORDER — LEDIPASVIR-SOFOSBUVIR 90-400 MG PO TABS: 1.0000 | ORAL_TABLET | Freq: Every day | ORAL | 2 refills | Status: DC

## 2016-09-30 ENCOUNTER — Telehealth: Payer: Self-pay | Admitting: *Deleted

## 2016-09-30 NOTE — Telephone Encounter (Signed)
Called patient to notify him of elastography appt for 10/14/16 at 7:45 AM. He has a sitter that will be bringing him and needs a later appt. He will call [phone] to have this appt rescheduled. Myrtis Hopping

## 2016-10-07 ENCOUNTER — Telehealth: Payer: Self-pay | Admitting: Emergency Medicine

## 2016-10-07 NOTE — Telephone Encounter (Signed)
Pt called and states he would like to come by and pick up a copy of all his lab and test results. Can you call him when those are ready for pick up. Please advise thanks.

## 2016-10-08 NOTE — Telephone Encounter (Signed)
Labs printed and up front ready for pt to pick up. Pt is aware.

## 2016-10-14 ENCOUNTER — Ambulatory Visit (HOSPITAL_COMMUNITY)
Admission: RE | Admit: 2016-10-14 | Discharge: 2016-10-14 | Disposition: A | Discharge status: Home / Self Care | Payer: BLUE CROSS/BLUE SHIELD | Source: Ambulatory Visit | Attending: Internal Medicine | Admitting: Internal Medicine

## 2016-10-14 DIAGNOSIS — B182 Chronic viral hepatitis C: Secondary | ICD-10-CM | POA: Diagnosis not present

## 2016-10-19 ENCOUNTER — Ambulatory Visit: Payer: BLUE CROSS/BLUE SHIELD | Admitting: Family

## 2016-10-21 ENCOUNTER — Telehealth: Payer: Self-pay | Admitting: Family

## 2016-10-21 ENCOUNTER — Other Ambulatory Visit: Payer: Self-pay | Admitting: Pharmacist Clinician (PhC)/ Clinical Pharmacy Specialist

## 2016-10-21 MED ORDER — LEDIPASVIR-SOFOSBUVIR 90-400 MG PO TABS: 1.0000 | ORAL_TABLET | Freq: Every day | ORAL | 2 refills | Status: DC

## 2016-10-21 MED ORDER — FINASTERIDE 5 MG PO TABS: 5.0000 mg | ORAL_TABLET | Freq: Every day | ORAL | 2 refills | Status: DC

## 2016-10-21 NOTE — Telephone Encounter (Signed)
Patient states that Marya Amsler referred him to infectious disease for HEP C.  Patient states the office told him to get Marya Amsler to prescribe him proscar (finasteride) in place of saw palmetto b/c they are prescribing him harvoni and states he can not take that with saw palmetto.  Patient was taking saw palmetto b/c he could not afford flomax.  Patient uses [facility] on [address].

## 2016-10-21 NOTE — Telephone Encounter (Signed)
Finasteride sent to pharmacy

## 2016-10-21 NOTE — Telephone Encounter (Signed)
Please advise 

## 2016-10-22 NOTE — Telephone Encounter (Signed)
Pt aware.

## 2016-10-25 MED ORDER — FINASTERIDE 5 MG PO TABS: 5.0000 mg | ORAL_TABLET | Freq: Every day | ORAL | 2 refills | Status: DC

## 2016-10-25 NOTE — Telephone Encounter (Signed)
Please see first note.  Script sent to wrong pharmacy.  Patient needs script ASAP.  Thanks!

## 2016-10-25 NOTE — Addendum Note (Signed)
Addended by: Delice Bison E on: 10/25/2016 01:49 PM   Modules accepted: Orders

## 2016-10-25 NOTE — Telephone Encounter (Signed)
Rx sent. LVM letting pt know.  

## 2016-11-01 ENCOUNTER — Encounter: Payer: Self-pay | Admitting: Pharmacy Technician

## 2016-11-03 ENCOUNTER — Encounter: Payer: BLUE CROSS/BLUE SHIELD | Admitting: Gastroenterology

## 2016-11-15 ENCOUNTER — Ambulatory Visit: Payer: BLUE CROSS/BLUE SHIELD

## 2016-11-17 ENCOUNTER — Ambulatory Visit: Payer: BLUE CROSS/BLUE SHIELD

## 2016-11-23 ENCOUNTER — Ambulatory Visit: Payer: Self-pay | Admitting: Pharmacist

## 2016-11-23 DIAGNOSIS — B182 Chronic viral hepatitis C: Secondary | ICD-10-CM

## 2016-11-23 DIAGNOSIS — N400 Enlarged prostate without lower urinary tract symptoms: Secondary | ICD-10-CM

## 2016-11-23 MED ORDER — FINASTERIDE 5 MG PO TABS: 5.0000 mg | ORAL_TABLET | Freq: Every day | ORAL | 2 refills | Status: DC

## 2016-11-23 NOTE — Progress Notes (Signed)
HPI: Carl Gardner is a 61 y.o. male who presents to the [facility] for follow-up of his Hep C infection.  He has genotype 1b and started Harvoni ~11/02/16. He has F0/F1.   Lab Results  Component Value Date   HCVGENOTYPE 1b 09/22/2016    Allergies: Allergies  Allergen Reactions  . Penicillins Hives    Past Medical History: Past Medical History:  Diagnosis Date  . BPH (benign prostatic hypertrophy)   . Hepatitis    Hep C  . History of chicken pox     Social History: Social History   Social History  . Marital status: Legally Separated    Spouse name: N/A  . Number of children: 2  . Years of education: 12   Occupational History  . Caregiver    Social History Main Topics  . Smoking status: Never Smoker  . Smokeless tobacco: Never Used  . Alcohol use No     Comment: last use friday 1200  . Drug use: Yes    Frequency: 3.0 times per week    Types: Marijuana  . Sexual activity: Not on file   Other Topics Concern  . Not on file   Social History Narrative   Fun: Take care of mom    Labs: Hep B S Ab (no units)  Date Value  09/22/2016 NEG   Hepatitis B Surface Ag (no units)  Date Value  09/22/2016 NEGATIVE   HCV Ab (no units)  Date Value  08/13/2016 REACTIVE (A)    Lab Results  Component Value Date   HCVGENOTYPE 1b 09/22/2016    Hepatitis C RNA quantitative Latest Ref Rng & Units 08/13/2016  HCV Quantitative <15 IU/mL 652,914(H)  HCV Quantitative Log <1.18 log 10 5.81(H)    AST (U/L)  Date Value  09/22/2016 25  08/13/2016 27  06/27/2015 37   ALT (U/L)  Date Value  09/22/2016 32  08/13/2016 30  06/27/2015 36   INR (no units)  Date Value  09/22/2016 0.9    CrCl: CrCl cannot be calculated (Patient's most recent lab result is older than the maximum 21 days allowed.).  Fibrosis Score: F0/F1 as assessed by elastography   Child-Pugh Score: A  Previous Treatment Regimen: None  Assessment: Carl Gardner is here for Hep C  follow-up. He started Harvoni ~3 and a half weeks ago.  He tells me he has not missed any doses.  He marks it off on his calendar every time he takes it. He states there may have been a day he took 2 but he wasn't sure.  I re-educated him on the importance of not missing any doses and taking it around the same time every day.  He does say that he is experiencing some fatigue but otherwise, no side effects at all.  He stopped taking his saw palmetto for his BPH and started taking Proscar in the meantime while on Harvoni since we don't have much data on saw palmetto and an interaction with Harvoni or not. He paid $59 for his Proscar at [facility], so I sent it to [facility] where he should only have to pay $9. I will get labs today since it is close to 1 month into treatment and make all his follow up appointments.    Plans: - Continue Harvoni through [facility] x 12 weeks - Hep C viral load today - F/u with pharmacy at [facility] 4/17 at 9am - F/u with lab for [facility] 7/17 at 9am - F/u with pharmacy 1 week  later for [facility] visit 7/24 at [address] [place]. Westley Gambles, PharmD Infectious Diseases [place] [facility] Infect[facility] 11/23/2016, 11:51 AM

## 2016-11-25 LAB — HEPATITIS C RNA QUANTITATIVE
HCV Quantitative Log: 1.32 Log IU/mL — ABNORMAL HIGH
HCV Quantitative: 21 IU/mL — ABNORMAL HIGH

## 2016-11-26 ENCOUNTER — Telehealth: Payer: Self-pay | Admitting: Pharmacist

## 2016-11-26 NOTE — Telephone Encounter (Signed)
[facility] called and wanted to know what he could take for a headache that he has had since yesterday.  Told him he could take Motrin - 400 mg (2 tablets) up to 2 times per day.

## 2017-02-01 ENCOUNTER — Ambulatory Visit: Payer: BLUE CROSS/BLUE SHIELD

## 2017-02-08 ENCOUNTER — Ambulatory Visit (INDEPENDENT_AMBULATORY_CARE_PROVIDER_SITE_OTHER): Payer: Self-pay | Admitting: Pharmacist

## 2017-02-08 DIAGNOSIS — N400 Enlarged prostate without lower urinary tract symptoms: Secondary | ICD-10-CM

## 2017-02-08 DIAGNOSIS — B182 Chronic viral hepatitis C: Secondary | ICD-10-CM

## 2017-02-08 MED ORDER — FINASTERIDE 5 MG PO TABS: 5.0000 mg | ORAL_TABLET | Freq: Every day | ORAL | 2 refills | Status: DC

## 2017-02-08 NOTE — Progress Notes (Signed)
HPI: Carl Gardner is a 61 y.o. male who presents to the [facility] clinic for [facility] follow-up of his Hep C infection. He has genotype 1b, F0/F1, and finished up 3 months of Harvoni last week.   Lab Results  Component Value Date   HCVGENOTYPE 1b 09/22/2016    Allergies: Allergies  Allergen Reactions  . Penicillins Hives    Past Medical History: Past Medical History:  Diagnosis Date  . BPH (benign prostatic hypertrophy)   . Hepatitis    Hep C  . History of chicken pox     Social History: Social History   Social History  . Marital status: Legally Separated    Spouse name: N/A  . Number of children: 2  . Years of education: 12   Occupational History  . Caregiver    Social History Main Topics  . Smoking status: Never Smoker  . Smokeless tobacco: Never Used  . Alcohol use No     Comment: last use friday 1200  . Drug use: Yes    Frequency: 3.0 times per week    Types: Marijuana  . Sexual activity: Not on file   Other Topics Concern  . Not on file   Social History Narrative   Fun: Take care of mom    Labs: Hep B S Ab (no units)  Date Value  09/22/2016 NEG   Hepatitis B Surface Ag (no units)  Date Value  09/22/2016 NEGATIVE   HCV Ab (no units)  Date Value  08/13/2016 REACTIVE (A)    Lab Results  Component Value Date   HCVGENOTYPE 1b 09/22/2016    Hepatitis C RNA quantitative Latest Ref Rng & Units 11/23/2016 08/13/2016  HCV Quantitative NOT DETECTED IU/mL 21(H) 652,914(H)  HCV Quantitative Log NOT DETECTED Log IU/mL 1.32(H) 5.81(H)    AST (U/L)  Date Value  09/22/2016 25  08/13/2016 27  06/27/2015 37   ALT (U/L)  Date Value  09/22/2016 32  08/13/2016 30  06/27/2015 36   INR (no units)  Date Value  09/22/2016 0.9    CrCl: CrCl cannot be calculated (Patient's most recent lab result is older than the maximum 21 days allowed.).  Fibrosis Score: F0/F1 as assessed by elastography   Child-Pugh Score: A  Previous Treatment  Regimen: None  Assessment: Carl Gardner is here today for Hep C follow-up.  He completed 3 months of Harvoni without any issues. He tells me he did not miss a single dose. He is back to taking his Saw Palmetto for his BPH and asked that I send in some more Proscar.  Told him I would send in some more but he would have to get Rx somewhere else once he is finished up here. He tolerated Harvoni well.  Initially had some headaches and fatigue but has since resolved. His Hep C viral load was slightly detectable at 21 early on therapy.  We will get another viral load today and make his cure visits.    Plans: - [facility] Hep C viral load today - F/u 7/17 at 9am with lab for [facility] - F/u with me 7/24 at 9am for cure visit  Cassie L. Kuppelweiser, PharmD, [place] Infect[facility] 02/08/2017, 3:07 PM

## 2017-02-10 LAB — HEPATITIS C RNA QUANTITATIVE
HCV QUANT LOG: NOT DETECTED {Log_IU}/mL
HCV Quantitative: <15 IU/mL

## 2017-02-28 IMAGING — US US ABDOMEN COMPLETE W/ ELASTOGRAPHY
1 series · 13 of 25 positions shown · non-contrast
Comparison: None.

CLINICAL DATA: Patient with hepatitis-C.



[Series 1: us abdomen complete w/ elastography · 0.25mm/px · 13 of 125 slices shown]
[im 1/125]
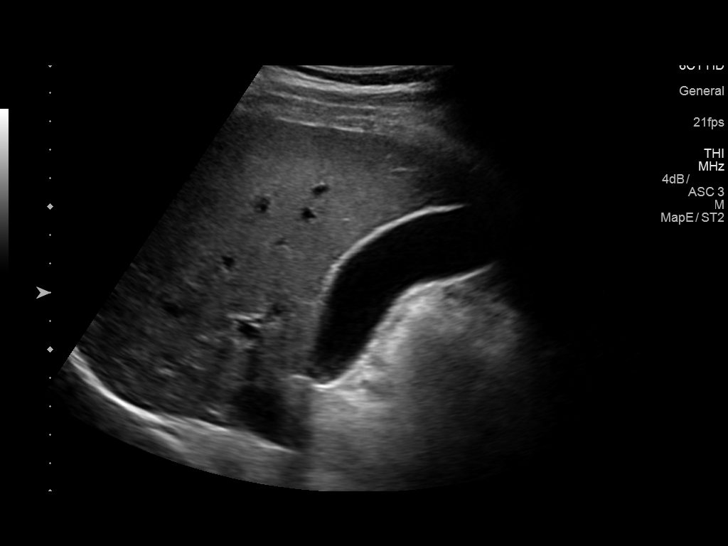
[im 11/125]
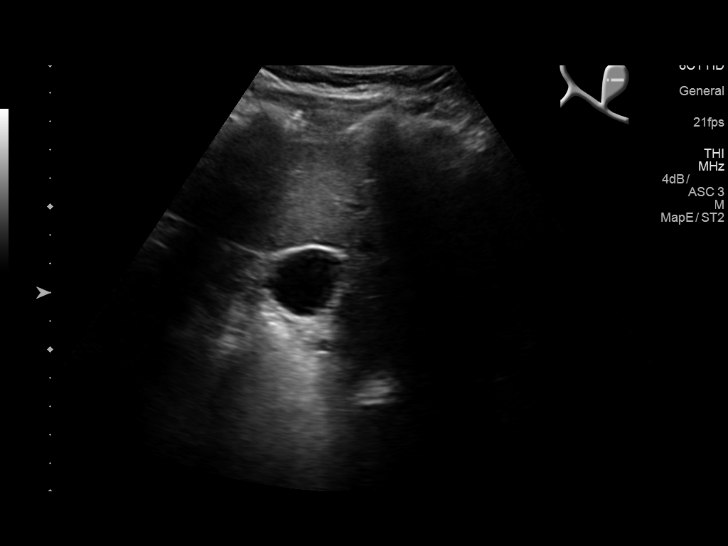
[im 21/125]
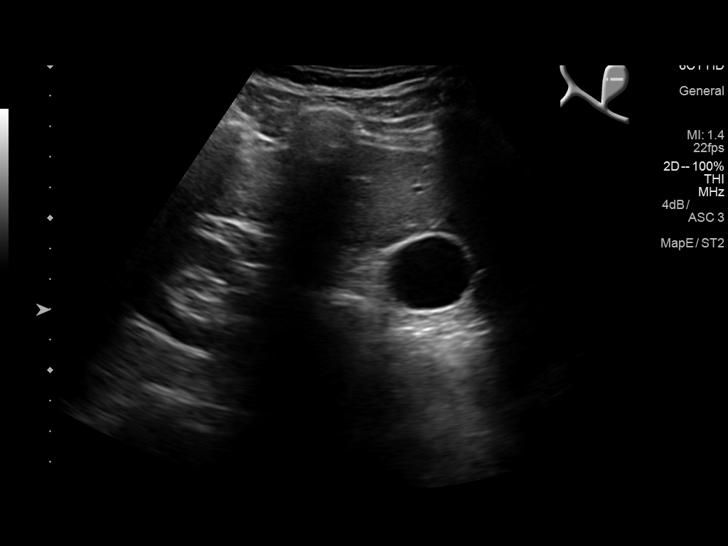
[im 32/125]
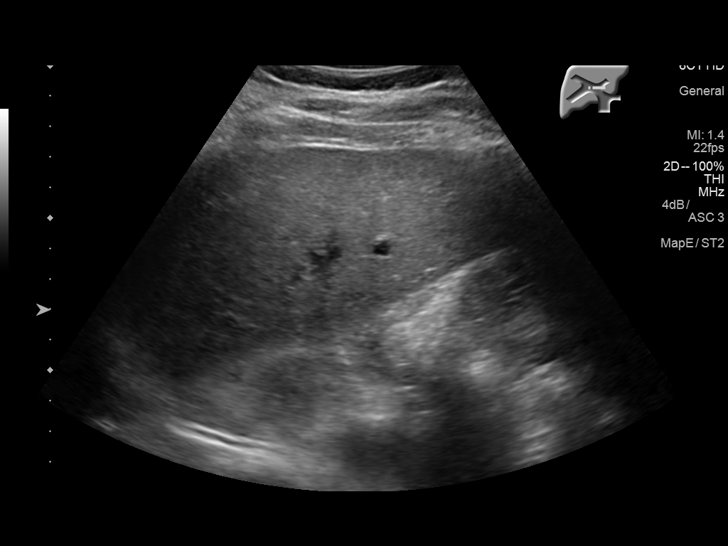
[im 42/125]
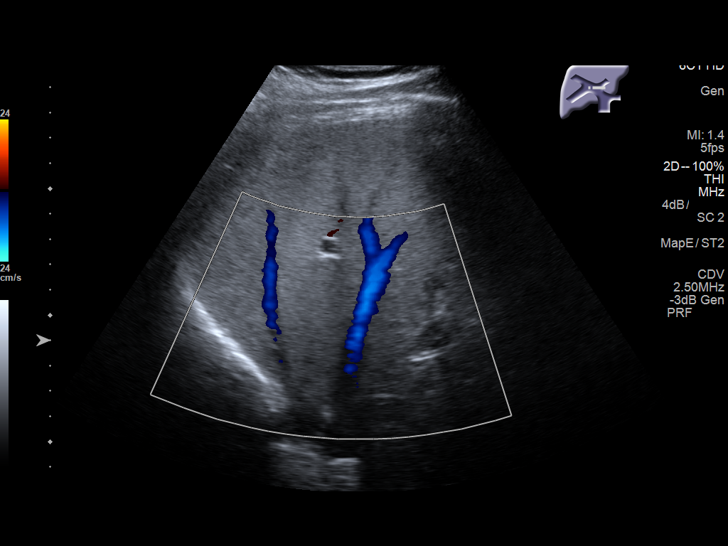
[im 52/125]
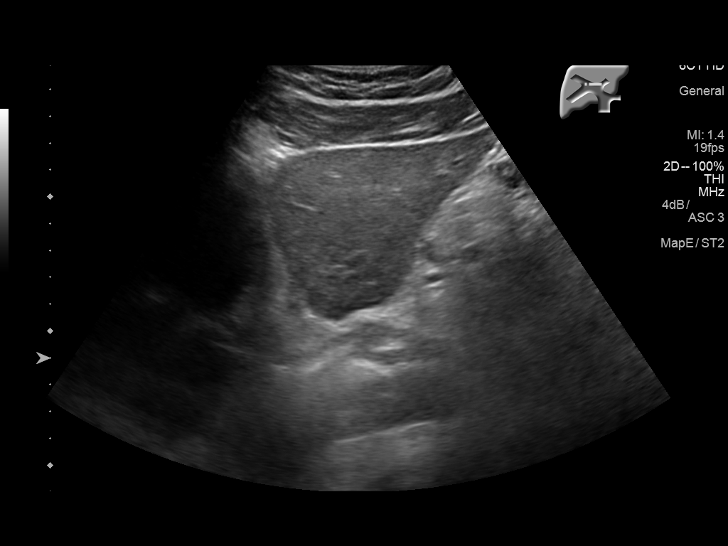
[im 63/125]
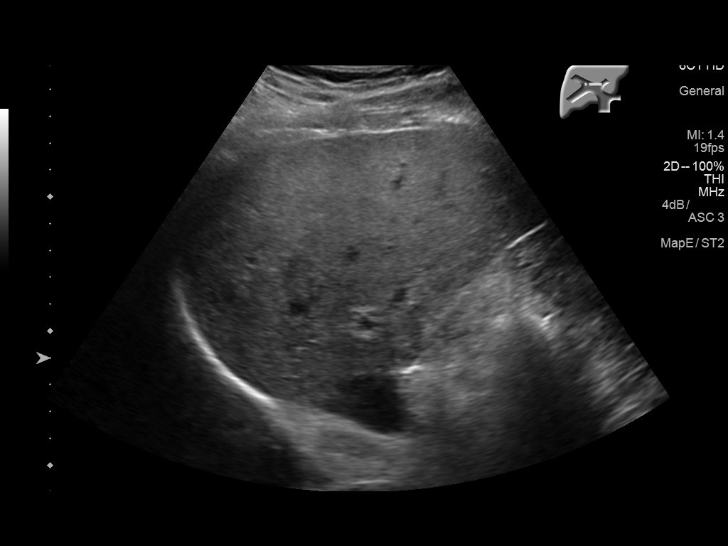
[im 73/125]
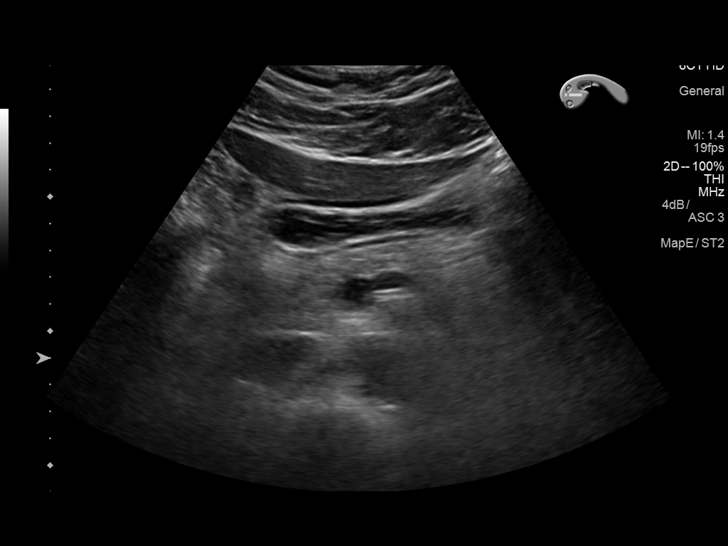
[im 83/125]
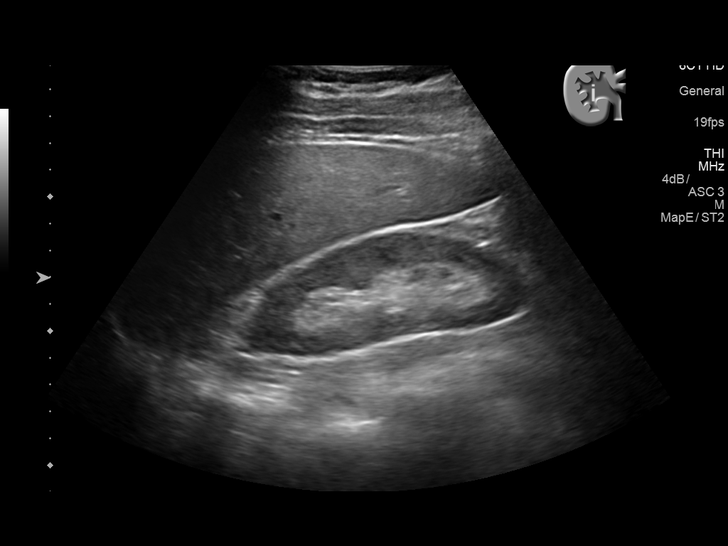
[im 94/125]
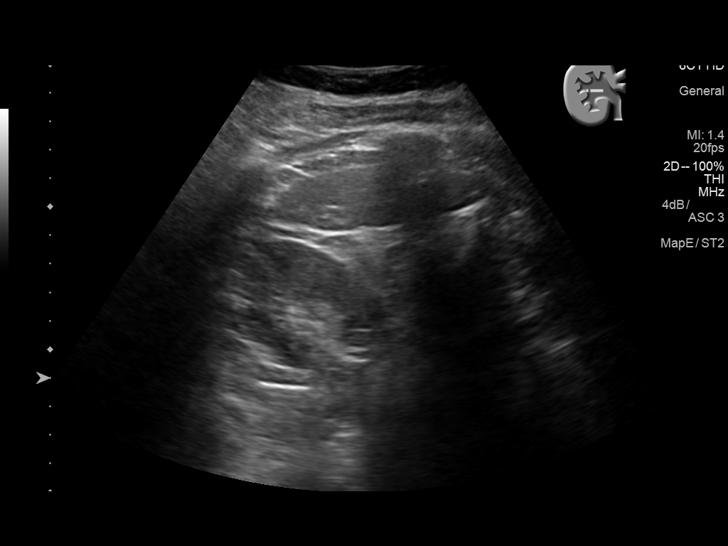
[im 104/125]
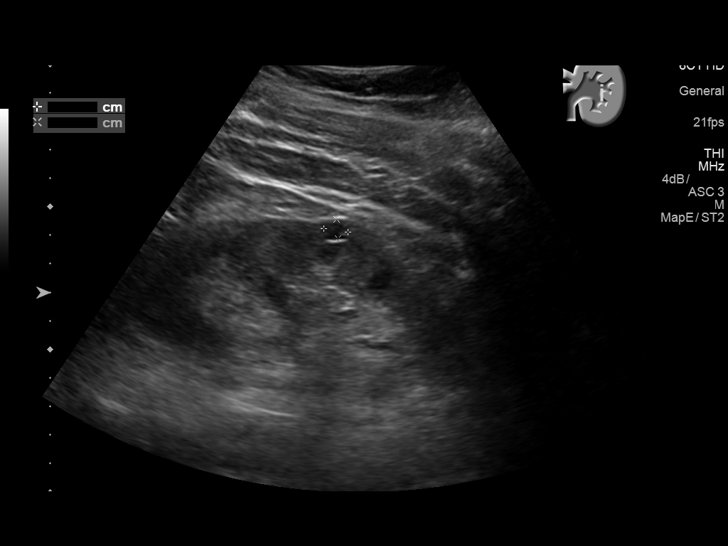
[im 114/125]
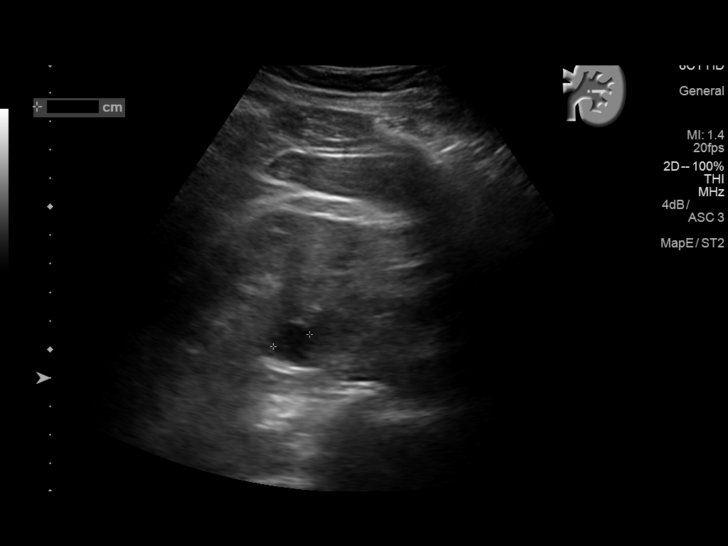
[im 125/125]
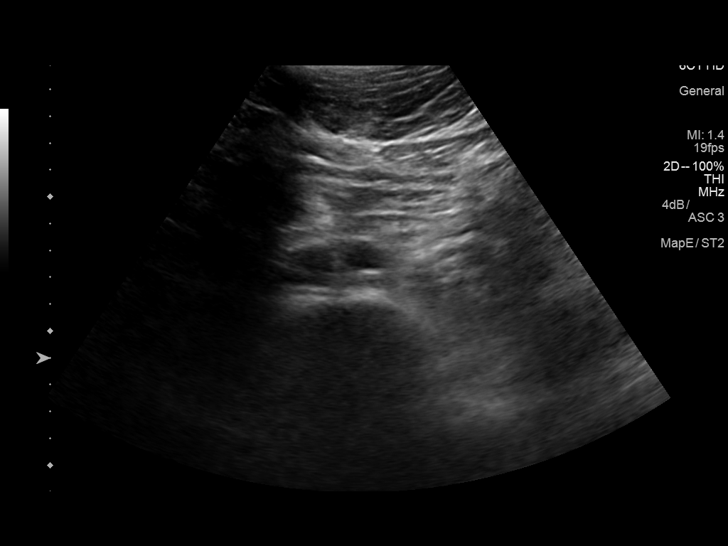

[13 of 25 positions shown; findings below may reference images not displayed]

FINDINGS: ULTRASOUND ABDOMEN

Gallbladder: No gallstones or wall thickening visualized. No
sonographic Murphy sign noted by sonographer.

Common bile duct: Diameter: 3 mm

Liver: No focal lesion identified. Within normal limits in
parenchymal echogenicity.

IVC: No abnormality visualized.

Pancreas: Visualized portion unremarkable.

Spleen: Size and appearance within normal limits.

Right Kidney: Length: 11.0 cm. Echogenicity within normal limits. No
mass or hydronephrosis visualized.

Left Kidney: Length: 13.1 cm. Normal renal cortical thickness and
echogenicity. No hydronephrosis. There is a 1.7 x 1.7 x 1.3 cm cyst
within the interpolar region the left kidney. There is an additional
too small to characterize subcentimeter hypoechoic lesion.

Abdominal aorta: No aneurysm visualized.

Other findings: None.

ULTRASOUND HEPATIC ELASTOGRAPHY

Device: Siemens Helix VTQ

Patient position: Left Lateral Decubitus

Transducer 6C1

Number of measurements: 10

Hepatic segment:  8

Median velocity:   0.79  m/sec

IQR:

IQR/Median velocity ratio:

Corresponding Metavir fibrosis score:  F0/F1

Risk of fibrosis: Minimal

Limitations of exam: None

Pertinent findings noted on other imaging exams:  None

Please note that abnormal shear wave velocities may also be
identified in clinical settings other than with hepatic fibrosis,
such as: acute hepatitis, elevated right heart and central venous
pressures including use of beta blockers, Rizlee disease
(Granda), infiltrative processes such as
mastocytosis/amyloidosis/infiltrative tumor, extrahepatic
cholestasis, in the post-prandial state, and liver transplantation.
Correlation with patient history, laboratory data, and clinical
condition recommended.
IMPRESSION: ULTRASOUND ABDOMEN:
Unremarkable abdominal ultrasound.

ULTRASOUND HEPATIC ELASTOGRAPHY:

Median hepatic shear wave velocity is calculated at 0.79 m/sec.

Corresponding Metavir fibrosis score is  F0/F1.

Risk of fibrosis is Minimal.

Follow-up: None required

## 2017-05-03 ENCOUNTER — Other Ambulatory Visit: Payer: Self-pay

## 2017-05-03 DIAGNOSIS — B182 Chronic viral hepatitis C: Secondary | ICD-10-CM

## 2017-05-05 LAB — HEPATITIS C RNA QUANTITATIVE
HCV QUANT LOG: NOT DETECTED {Log_IU}/mL
HCV Quantitative: <15 IU/mL

## 2017-05-10 ENCOUNTER — Ambulatory Visit (INDEPENDENT_AMBULATORY_CARE_PROVIDER_SITE_OTHER): Payer: Self-pay | Admitting: Pharmacist Clinician (PhC)/ Clinical Pharmacy Specialist

## 2017-05-10 DIAGNOSIS — Z23 Encounter for immunization: Secondary | ICD-10-CM

## 2017-05-10 DIAGNOSIS — B182 Chronic viral hepatitis C: Secondary | ICD-10-CM

## 2017-05-10 NOTE — Patient Instructions (Signed)
Come back on 8/23 for the second hepatitis B vaccine

## 2017-05-10 NOTE — Progress Notes (Signed)
HPI: Carl Gardner is a 61 y.o. male who is here for his cure visit with pharmacy.   Lab Results  Component Value Date   HCVGENOTYPE 1b 09/22/2016    Allergies: Allergies  Allergen Reactions  . Penicillins Hives    Vitals:    Past Medical History: Past Medical History:  Diagnosis Date  . BPH (benign prostatic hypertrophy)   . Hepatitis    Hep C  . History of chicken pox     Social History: Social History   Social History  . Marital status: Legally Separated    Spouse name: N/A  . Number of children: 2  . Years of education: 12   Occupational History  . Caregiver    Social History Main Topics  . Smoking status: Never Smoker  . Smokeless tobacco: Never Used  . Alcohol use No     Comment: last use friday 1200  . Drug use: Yes    Frequency: 3.0 times per week    Types: Marijuana  . Sexual activity: Not on file   Other Topics Concern  . Not on file   Social History Narrative   Fun: Take care of mom    Labs: Hep B S Ab (no units)  Date Value  09/22/2016 NEG   Hepatitis B Surface Ag (no units)  Date Value  09/22/2016 NEGATIVE   HCV Ab (no units)  Date Value  08/13/2016 REACTIVE (A)    Lab Results  Component Value Date   HCVGENOTYPE 1b 09/22/2016    Hepatitis C RNA quantitative Latest Ref Rng & Units 05/03/2017 02/08/2017 11/23/2016 08/13/2016  HCV Quantitative NOT DETECTED IU/mL <15 NOT DETECTED <15 NOT DETECTED 21(H) 652,914(H)  HCV Quantitative Log NOT DETECTED Log IU/mL <1.18 NOT DETECTED <1.18 NOT DETECTED 1.32(H) 5.81(H)    AST (U/L)  Date Value  09/22/2016 25  08/13/2016 27  06/27/2015 37   ALT (U/L)  Date Value  09/22/2016 32  08/13/2016 30  06/27/2015 36   INR (no units)  Date Value  09/22/2016 0.9    CrCl: CrCl cannot be calculated (Patient's most recent lab result is older than the maximum 21 days allowed.).  Fibrosis Score: F0/1 as assessed by ARFI  Child-Pugh Score: Class A  Previous Treatment  Regimen: None  Assessment: Carl Gardner had his SVR12 a week ago and it came back neg. Therefore he is cured now. He has no idea how he got hep C except for the fact that he had a transfusion a long time ago as at kid. He is hep B titer neg so he would like to start the hep B series today. He'll would only need to f/u back up for the hep B vaccine only.   Recommendations:  Hep C is cured Hep B#1 today F/u in 1 month for hep B#2  [employer], Pharm.D., BCPS, AAHIVP Clinical Infectious [place] for Infectious Disease 05/10/2017, 9:33 AM

## 2017-06-09 ENCOUNTER — Ambulatory Visit: Payer: Self-pay

## 2017-08-01 ENCOUNTER — Ambulatory Visit (INDEPENDENT_AMBULATORY_CARE_PROVIDER_SITE_OTHER): Payer: Self-pay

## 2017-08-01 DIAGNOSIS — Z23 Encounter for immunization: Secondary | ICD-10-CM

## 2018-01-23 ENCOUNTER — Encounter (INDEPENDENT_AMBULATORY_CARE_PROVIDER_SITE_OTHER): Payer: Self-pay | Admitting: Physician Assistant

## 2018-01-23 ENCOUNTER — Other Ambulatory Visit: Payer: Self-pay

## 2018-01-23 ENCOUNTER — Ambulatory Visit (INDEPENDENT_AMBULATORY_CARE_PROVIDER_SITE_OTHER): Payer: Self-pay | Admitting: Physician Assistant

## 2018-01-23 VITALS — BP 142/95 | HR 61 | Temp 97.9°F | Wt 241.4 lb

## 2018-01-23 DIAGNOSIS — K643 Fourth degree hemorrhoids: Secondary | ICD-10-CM

## 2018-01-23 DIAGNOSIS — N401 Enlarged prostate with lower urinary tract symptoms: Secondary | ICD-10-CM

## 2018-01-23 DIAGNOSIS — R3911 Hesitancy of micturition: Secondary | ICD-10-CM

## 2018-01-23 DIAGNOSIS — Z1211 Encounter for screening for malignant neoplasm of colon: Secondary | ICD-10-CM

## 2018-01-23 DIAGNOSIS — N451 Epididymitis: Secondary | ICD-10-CM

## 2018-01-23 LAB — POCT URINALYSIS DIPSTICK
Bilirubin, UA: NEGATIVE
Glucose, UA: NEGATIVE
Ketones, UA: NEGATIVE
LEUKOCYTES UA: NEGATIVE
NITRITE UA: NEGATIVE
PROTEIN UA: NEGATIVE
SPEC GRAV UA: 1.02 (ref 1.010–1.025)
Urobilinogen, UA: 0.2 E.U./dL
pH, UA: 5 (ref 5.0–8.0)

## 2018-01-23 MED ORDER — NAPROXEN 500 MG PO TABS: 500.0000 mg | ORAL_TABLET | Freq: Two times a day (BID) | ORAL | 0 refills | Status: DC

## 2018-01-23 MED ORDER — TAMSULOSIN HCL 0.4 MG PO CAPS: 0.4000 mg | ORAL_CAPSULE | Freq: Every day | ORAL | 3 refills | Status: DC

## 2018-01-23 MED ORDER — CIPROFLOXACIN HCL 500 MG PO TABS: 500.0000 mg | ORAL_TABLET | Freq: Two times a day (BID) | ORAL | 0 refills | Status: DC

## 2018-01-23 NOTE — Patient Instructions (Signed)
Epididymitis Epididymitis is swelling (inflammation) of the epididymis. The epididymis is a cord-like structure that is located along the top and back part of the testicle. It collects and stores sperm from the testicle. This condition can also cause pain and swelling of the testicle and scrotum. Symptoms usually start suddenly (acute epididymitis). Sometimes epididymitis starts gradually and lasts for a while (chronic epididymitis). This type may be harder to treat. What are the causes? In men 35 and younger, this condition is usually caused by a bacterial infection or sexually transmitted disease (STD), such as:  Gonorrhea.  Chlamydia.  In men 35 and older who do not have anal sex, this condition is usually caused by bacteria from a blockage or abnormalities in the urinary system. These can result from:  Having a tube placed into the bladder (urinary catheter).  Having an enlarged or inflamed prostate gland.  Having recent urinary tract surgery.  In men who have a condition that weakens the body's defense system (immune system), such as HIV, this condition can be caused by:  Other bacteria, including tuberculosis and syphilis.  Viruses.  Fungi.  Sometimes this condition occurs without infection. That may happen if urine flows backward into the epididymis after heavy lifting or straining. What increases the risk? This condition is more likely to develop in men:  Who have unprotected sex with more than one partner.  Who have anal sex.  Who have recently had surgery.  Who have a urinary catheter.  Who have urinary problems.  Who have a suppressed immune system.  What are the signs or symptoms? This condition usually begins suddenly with chills, fever, and pain behind the scrotum and in the testicle. Other symptoms include:  Swelling of the scrotum, testicle, or both.  Pain whenejaculatingor urinating.  Pain in the back or belly.  Nausea.  Itching and discharge  from the penis.  Frequent need to pass urine.  Redness and tenderness of the scrotum.  How is this diagnosed? Your health care provider can diagnose this condition based on your symptoms and medical history. Your health care provider will also do a physical exam to ask about your symptoms and check your scrotum and testicle for swelling, pain, and redness. You may also have other tests, including:  Examination of discharge from the penis.  Urine tests for infections, such as STDs.  Your health care provider may test you for other STDs, including HIV. How is this treated? Treatment for this condition depends on the cause. If your condition is caused by a bacterial infection, oral antibiotic medicine may be prescribed. If the bacterial infection has spread to your blood, you may need to receive IV antibiotics. Nonbacterial epididymitis is treated with home care that includes bed rest and elevation of the scrotum. Surgery may be needed to treat:  Bacterial epididymitis that causes pus to build up in the scrotum (abscess).  Chronic epididymitis that has not responded to other treatments.  Follow these instructions at home: Medicines  Take over-the-counter and prescription medicines only as told by your health care provider.  If you were prescribed an antibiotic medicine, take it as told by your health care provider. Do not stop taking the antibiotic even if your condition improves. Sexual Activity  If your epididymitis was caused by an STD, avoid sexual activity until your treatment is complete.  Inform your sexual partner or partners if you test positive for an STD. They may need to be treated.Do not engage in sexual activity with your partner or   partners until their treatment is completed. General instructions  Return to your normal activities as told by your health care provider. Ask your health care provider what activities are safe for you.  Keep your scrotum elevated and  supported while resting. Ask your health care provider if you should wear a scrotal support, such as a jockstrap. Wear it as told by your health care provider.  If directed, apply ice to the affected area: ? Put ice in a plastic bag. ? Place a towel between your skin and the bag. ? Leave the ice on for 20 minutes, 2-3 times per day.  Try taking a sitz bath to help with discomfort. This is a warm water bath that is taken while you are sitting down. The water should only come up to your hips and should cover your buttocks. Do this 3-4 times per day or as told by your health care provider.  Keep all follow-up visits as told by your health care provider. This is important. Contact a health care provider if:  You have a fever.  Your pain medicine is not helping.  Your pain is getting worse.  Your symptoms do not improve within three days. This information is not intended to replace advice given to you by your health care provider. Make sure you discuss any questions you have with your health care provider. Document Released: 10/01/2000 Document Revised: 03/11/2016 Document Reviewed: 02/19/2015 Elsevier Interactive Patient Education  2018 [employer]  

## 2018-01-23 NOTE — Progress Notes (Signed)
Subjective:  Patient ID: Carl Gardner, male    DOB: Jul 23, 1956  Age: 62 y.o. MRN: [id number]  CC: testicular pain  HPI NELS MUNN is a 62 y.o. male with a medical history of BPH, HCV, HLD, and left shoulder surgery presents as a new patient with complaint of left testicular pain. Pain located at the epididymus and radiates superiorly to the left spermatic cord. Tender to palpation. No swelling, redness, penile drainage. Believes his symptoms are attributed to his BPH. Currently taking Finasteride 5 mg qday for the past 3-4 years. Last PSA 0.48 ng/mL on 08/13/16. Reports finasteride as being only mildly to moderately helpful. Take Saw Palmetto to further relief symptoms of urinary hesitancy and dribbling. Endorses LBP. Does not endorse perineal pain, f/c/n/v, or dysuria. No other symptoms or complaints endorsed.    Outpatient Medications Prior to Visit  Medication Sig Dispense Refill  . finasteride (PROSCAR) 5 MG tablet Take 1 tablet (5 mg total) by mouth daily. 90 tablet 2   No facility-administered medications prior to visit.      ROS Review of Systems  Constitutional: Negative for chills, fever and malaise/fatigue.  Eyes: Negative for blurred vision.  Respiratory: Negative for shortness of breath.   Cardiovascular: Negative for chest pain and palpitations.  Gastrointestinal: Negative for abdominal pain and nausea.  Genitourinary: Positive for urgency. Negative for dysuria and hematuria.  Musculoskeletal: Positive for back pain. Negative for joint pain and myalgias.  Skin: Negative for rash.  Neurological: Negative for tingling and headaches.  Psychiatric/Behavioral: Negative for depression. The patient is not nervous/anxious.     Objective:  BP (!) 142/95 (BP Location: Left Arm, Patient Position: Sitting, Cuff Size: Large)   Pulse 61   Temp 97.9 F (36.6 C) (Oral)   Wt 241 lb 6.4 oz (109.5 kg)   SpO2 97%   BMI 36.17 kg/m   BP/Weight 01/23/2018 09/22/2016  25/85/2778  Systolic BP 242 353 614  Diastolic BP 95 85 68  Wt. (Lbs) 241.4 241 240  BMI 36.17 36.11 35.96      Physical Exam  Constitutional: He is oriented to person, place, and time.  Well developed, well nourished, NAD, polite  HENT:  Head: Normocephalic and atraumatic.  Eyes: No scleral icterus.  Neck: Normal range of motion. Neck supple. No thyromegaly present.  Cardiovascular: Normal rate, regular rhythm and normal heart sounds.  Pulmonary/Chest: Effort normal and breath sounds normal.  Abdominal: Soft. Bowel sounds are normal. There is no tenderness.  Genitourinary:  Genitourinary Comments: TTP along the left testicular epididymus and superiorly to the spermatic cord. No testicular edema, erythema, varicocele, or hydrocele. No inguinal hernia bilaterally. Penis normal. No inguinal lymphadenopathy. Several anal tags.   Musculoskeletal: He exhibits no edema.  Neurological: He is alert and oriented to person, place, and time.  Skin: Skin is warm and dry. No rash noted. No erythema. No pallor.  Psychiatric: He has a normal mood and affect. His behavior is normal. Thought content normal.  Vitals reviewed.    Assessment & Plan:   1. Epididymitis - naproxen (NAPROSYN) 500 MG tablet; Take 1 tablet (500 mg total) by mouth 2 (two) times daily with a meal.  Dispense: 30 tablet; Refill: 0 - ciprofloxacin (CIPRO) 500 MG tablet; Take 1 tablet (500 mg total) by mouth 2 (two) times daily.  Dispense: 60 tablet; Refill: 0 - Advised to wear supportive underwear.   2. Grade IV hemorrhoids - Will refer to gastroenterology after pt completes CAFA.  3. Benign prostatic  hyperplasia with urinary hesitancy - PSA  4. Urinary hesitancy - Urinalysis Dipstick - Comprehensive metabolic panel - CBC with Differential - tamsulosin (FLOMAX) 0.4 MG CAPS capsule; Take 1 capsule (0.4 mg total) by mouth daily.  Dispense: 30 capsule; Refill: 3   Meds ordered this encounter  Medications  . naproxen  (NAPROSYN) 500 MG tablet    Sig: Take 1 tablet (500 mg total) by mouth 2 (two) times daily with a meal.    Dispense:  30 tablet    Refill:  0    Order Specific Question:   Supervising Provider    Answer:   Tresa Garter [id number]  . ciprofloxacin (CIPRO) 500 MG tablet    Sig: Take 1 tablet (500 mg total) by mouth 2 (two) times daily.    Dispense:  60 tablet    Refill:  0    Order Specific Question:   Supervising Provider    Answer:   Tresa Garter [id number]  . tamsulosin (FLOMAX) 0.4 MG CAPS capsule    Sig: Take 1 capsule (0.4 mg total) by mouth daily.    Dispense:  30 capsule    Refill:  3    Order Specific Question:   Supervising Provider    Answer:   Tresa Garter [[id number]]    Follow-up: Return in about 1 month (around 02/20/2018) for urinary heistancy.   Clent Demark PA

## 2018-01-24 LAB — CBC WITH DIFFERENTIAL/PLATELET
BASOS ABS: 0 10*3/uL (ref 0.0–0.2)
BASOS: 0 %
EOS (ABSOLUTE): 0.1 10*3/uL (ref 0.0–0.4)
Eos: 1 %
HEMOGLOBIN: 14.9 g/dL (ref 13.0–17.7)
Hematocrit: 44 % (ref 37.5–51.0)
IMMATURE GRANS (ABS): 0 10*3/uL (ref 0.0–0.1)
Immature Granulocytes: 0 %
LYMPHS ABS: 2.8 10*3/uL (ref 0.7–3.1)
LYMPHS: 50 %
MCH: 33.8 pg — AB (ref 26.6–33.0)
MCHC: 33.9 g/dL (ref 31.5–35.7)
MCV: 100 fL — ABNORMAL HIGH (ref 79–97)
MONOCYTES: 8 %
Monocytes Absolute: 0.5 10*3/uL (ref 0.1–0.9)
NEUTROS ABS: 2.4 10*3/uL (ref 1.4–7.0)
Neutrophils: 41 %
Platelets: 203 10*3/uL (ref 150–379)
RBC: 4.41 x10E6/uL (ref 4.14–5.80)
RDW: 13.6 % (ref 12.3–15.4)
WBC: 5.7 10*3/uL (ref 3.4–10.8)

## 2018-01-24 LAB — COMPREHENSIVE METABOLIC PANEL
ALBUMIN: 4.5 g/dL (ref 3.6–4.8)
ALT: 20 IU/L (ref 0–44)
AST: 24 IU/L (ref 0–40)
Albumin/Globulin Ratio: 1.6 (ref 1.2–2.2)
Alkaline Phosphatase: 88 IU/L (ref 39–117)
BILIRUBIN TOTAL: 0.3 mg/dL (ref 0.0–1.2)
BUN / CREAT RATIO: 12 (ref 10–24)
BUN: 13 mg/dL (ref 8–27)
CALCIUM: 9 mg/dL (ref 8.6–10.2)
CO2: 22 mmol/L (ref 20–29)
CREATININE: 1.13 mg/dL (ref 0.76–1.27)
Chloride: 106 mmol/L (ref 96–106)
GFR, EST AFRICAN AMERICAN: 81 mL/min/{1.73_m2} (ref >59)
GFR, EST NON AFRICAN AMERICAN: 70 mL/min/{1.73_m2} (ref >59)
GLUCOSE: 103 mg/dL — AB (ref 65–99)
Globulin, Total: 2.8 g/dL (ref 1.5–4.5)
Potassium: 4.4 mmol/L (ref 3.5–5.2)
Sodium: 142 mmol/L (ref 134–144)
TOTAL PROTEIN: 7.3 g/dL (ref 6.0–8.5)

## 2018-01-24 LAB — PSA: Prostate Specific Ag, Serum: 0.5 ng/mL (ref 0.0–4.0)

## 2018-01-25 LAB — FECAL OCCULT BLOOD, IMMUNOCHEMICAL: Fecal Occult Bld: NEGATIVE

## 2018-01-26 ENCOUNTER — Telehealth (INDEPENDENT_AMBULATORY_CARE_PROVIDER_SITE_OTHER): Payer: Self-pay

## 2018-01-26 NOTE — Telephone Encounter (Signed)
-----   Message from Clent Demark, PA-C sent at 01/25/2018  1:48 PM EDT ----- Negative FIT, Normal PSA, rest of labs normal.

## 2018-01-26 NOTE — Telephone Encounter (Signed)
Patient aware of negative FIT, normal PSA and all other normal labs. Nat Christen, CMA

## 2018-02-07 ENCOUNTER — Ambulatory Visit: Payer: Self-pay | Attending: Family Medicine

## 2018-02-20 ENCOUNTER — Ambulatory Visit (INDEPENDENT_AMBULATORY_CARE_PROVIDER_SITE_OTHER): Payer: Self-pay | Admitting: Physician Assistant

## 2018-02-20 ENCOUNTER — Encounter (INDEPENDENT_AMBULATORY_CARE_PROVIDER_SITE_OTHER): Payer: Self-pay | Admitting: Physician Assistant

## 2018-02-20 VITALS — BP 131/72 | HR 62 | Temp 97.7°F | Resp 18 | Ht 68.5 in | Wt 238.0 lb

## 2018-02-20 DIAGNOSIS — N400 Enlarged prostate without lower urinary tract symptoms: Secondary | ICD-10-CM

## 2018-02-20 DIAGNOSIS — R3911 Hesitancy of micturition: Secondary | ICD-10-CM

## 2018-02-20 DIAGNOSIS — N5089 Other specified disorders of the male genital organs: Secondary | ICD-10-CM

## 2018-02-20 MED ORDER — FINASTERIDE 5 MG PO TABS: 5.0000 mg | ORAL_TABLET | Freq: Every day | ORAL | 5 refills | Status: DC

## 2018-02-20 MED ORDER — TAMSULOSIN HCL 0.4 MG PO CAPS: 0.4000 mg | ORAL_CAPSULE | Freq: Every day | ORAL | 5 refills | Status: DC

## 2018-02-20 MED ORDER — SULFAMETHOXAZOLE-TRIMETHOPRIM 800-160 MG PO TABS: 1.0000 | ORAL_TABLET | Freq: Two times a day (BID) | ORAL | 0 refills | Status: DC

## 2018-02-20 MED ORDER — NAPROXEN 500 MG PO TABS: 500.0000 mg | ORAL_TABLET | Freq: Two times a day (BID) | ORAL | 0 refills | Status: DC

## 2018-02-20 NOTE — Progress Notes (Signed)
Subjective:  Patient ID: Carl Gardner, male    DOB: 1955-11-28  Age: 62 y.o. MRN: [id number]  CC: f/u  HPI Carl Gardner is a 62 y.o. male with a medical history of BPH, HCV, HLD, and left shoulder surgery presents to f/u on epididymitis and urinary hesitancy. Was prescribed Cipro and Naproxen and took as directed. Says his pain is reduced from a 9/10 to a 5/10. Describes continued urinary hesitancy, lower back pain, and left spermatic cord pain. Does not endorse any other symptoms.       Outpatient Medications Prior to Visit  Medication Sig Dispense Refill  . naproxen (NAPROSYN) 500 MG tablet Take 1 tablet (500 mg total) by mouth 2 (two) times daily with a meal. 30 tablet 0  . ciprofloxacin (CIPRO) 500 MG tablet Take 1 tablet (500 mg total) by mouth 2 (two) times daily. 60 tablet 0  . finasteride (PROSCAR) 5 MG tablet Take 1 tablet (5 mg total) by mouth daily. 90 tablet 2  . tamsulosin (FLOMAX) 0.4 MG CAPS capsule Take 1 capsule (0.4 mg total) by mouth daily. 30 capsule 3   No facility-administered medications prior to visit.      ROS Review of Systems  Constitutional: Negative for chills, fever and malaise/fatigue.  Eyes: Negative for blurred vision.  Respiratory: Negative for shortness of breath.   Cardiovascular: Negative for chest pain and palpitations.  Gastrointestinal: Negative for abdominal pain and nausea.  Genitourinary: Negative for dysuria and hematuria.       Hesitancy  Musculoskeletal: Negative for joint pain and myalgias.  Skin: Negative for rash.  Neurological: Negative for tingling and headaches.  Psychiatric/Behavioral: Negative for depression. The patient is not nervous/anxious.     Objective:  BP 131/72 (BP Location: Left Arm, Patient Position: Sitting, Cuff Size: Large)   Pulse 62   Temp 97.7 F (36.5 C) (Oral)   Resp 18   Ht 5' 8.5" (1.74 m)   Wt 238 lb (108 kg)   SpO2 96%   BMI 35.66 kg/m   BP/Weight 02/20/2018 01/23/2018 19/12/7900   Systolic BP 409 735 329  Diastolic BP 72 95 85  Wt. (Lbs) 238 241.4 241  BMI 35.66 36.17 36.11      Physical Exam  Constitutional: He is oriented to person, place, and time.  Well developed, well nourished, NAD, polite  HENT:  Head: Normocephalic and atraumatic.  Eyes: No scleral icterus.  Neck: Normal range of motion. Neck supple. No thyromegaly present.  Cardiovascular: Normal rate, regular rhythm and normal heart sounds.  Pulmonary/Chest: Effort normal and breath sounds normal.  Genitourinary:  Genitourinary Comments: Mild TTP of the left spermatic cord. No testicular torsion, no testicular swelling, no testicular pain. No hydrocele, no varicocele. No penile drainage.  Musculoskeletal: He exhibits no edema.  Neurological: He is alert and oriented to person, place, and time.  Skin: Skin is warm and dry. No rash noted. No erythema. No pallor.  Psychiatric: He has a normal mood and affect. His behavior is normal. Thought content normal.  Vitals reviewed.    Assessment & Plan:   1. Benign prostatic hyperplasia without lower urinary tract symptoms - Begin finasteride (PROSCAR) 5 MG tablet; Take 1 tablet (5 mg total) by mouth daily.  Dispense: 30 tablet; Refill: 5  2. Urinary hesitancy - Refill tamsulosin (FLOMAX) 0.4 MG CAPS capsule; Take 1 capsule (0.4 mg total) by mouth daily.  Dispense: 30 capsule; Refill: 5 - Begin sulfamethoxazole-trimethoprim (BACTRIM DS,SEPTRA DS) 800-160 MG tablet; Take 1  tablet by mouth 2 (two) times daily.  Dispense: 20 tablet; Refill: 0  3. Spermatic cord pain - Refill naproxen (NAPROSYN) 500 MG tablet; Take 1 tablet (500 mg total) by mouth 2 (two) times daily with a meal.  Dispense: 30 tablet; Refill: 0 - Begin sulfamethoxazole-trimethoprim (BACTRIM DS,SEPTRA DS) 800-160 MG tablet; Take 1 tablet by mouth 2 (two) times daily.  Dispense: 20 tablet; Refill: 0   Meds ordered this encounter  Medications  . tamsulosin (FLOMAX) 0.4 MG CAPS capsule     Sig: Take 1 capsule (0.4 mg total) by mouth daily.    Dispense:  30 capsule    Refill:  5    Order Specific Question:   Supervising Provider    Answer:   Tresa Garter [id number]  . finasteride (PROSCAR) 5 MG tablet    Sig: Take 1 tablet (5 mg total) by mouth daily.    Dispense:  30 tablet    Refill:  5    Order Specific Question:   Supervising Provider    Answer:   Tresa Garter [id number]  . naproxen (NAPROSYN) 500 MG tablet    Sig: Take 1 tablet (500 mg total) by mouth 2 (two) times daily with a meal.    Dispense:  30 tablet    Refill:  0    Order Specific Question:   Supervising Provider    Answer:   Tresa Garter [id number]  . sulfamethoxazole-trimethoprim (BACTRIM DS,SEPTRA DS) 800-160 MG tablet    Sig: Take 1 tablet by mouth 2 (two) times daily.    Dispense:  20 tablet    Refill:  0    Order Specific Question:   Supervising Provider    Answer:   Tresa Garter [id number]    Follow-up: Return in about 6 weeks (around 04/03/2018).   Clent Demark PA

## 2018-02-20 NOTE — Patient Instructions (Signed)
Benign Prostatic Hyperplasia  Benign prostatic hyperplasia (BPH) is an enlarged prostate gland that is caused by the normal aging process and not by cancer. The prostate is a walnut-sized gland that is involved in the production of semen. It is located in front of the rectum and below the bladder. The bladder stores urine and the urethra is the tube that carries the urine out of the body. The prostate may get bigger as a man gets older.  An enlarged prostate can press on the urethra. This can make it harder to pass urine. The build-up of urine in the bladder can cause infection. Back pressure and infection may progress to bladder damage and kidney (renal) failure.  What are the causes?  This condition is part of a normal aging process. However, not all men develop problems from this condition. If the prostate enlarges away from the urethra, urine flow will not be blocked. If it enlarges toward the urethra and compresses it, there will be problems passing urine.  What increases the risk?  This condition is more likely to develop in men over the age of 50 years.  What are the signs or symptoms?  Symptoms of this condition include:  · Getting up often during the night to urinate.  · Needing to urinate frequently during the day.  · Difficulty starting urine flow.  · Decrease in size and strength of your urine stream.  · Leaking (dribbling) after urinating.  · Inability to pass urine. This needs immediate treatment.  · Inability to completely empty your bladder.  · Pain when you pass urine. This is more common if there is also an infection.  · Urinary tract infection (UTI).    How is this diagnosed?  This condition is diagnosed based on your medical history, a physical exam, and your symptoms. Tests will also be done, such as:  · A post-void bladder scan. This measures any amount of urine that may remain in your bladder after you finish urinating.  · A digital rectal exam. In a rectal exam, your health care provider  checks your prostate by putting a lubricated, gloved finger into your rectum to feel the back of your prostate gland. This exam detects the size of your gland and any abnormal lumps or growths.  · An exam of your urine (urinalysis).  · A prostate specific antigen (PSA) screening. This is a blood test used to screen for prostate cancer.  · An ultrasound. This test uses sound waves to electronically produce a picture of your prostate gland.    Your health care provider may refer you to a specialist in kidney and prostate diseases (urologist).  How is this treated?  Once symptoms begin, your health care provider will monitor your condition (active surveillance or watchful waiting). Treatment for this condition will depend on the severity of your condition. Treatment may include:  · Observation and yearly exams. This may be the only treatment needed if your condition and symptoms are mild.  · Medicines to relieve your symptoms, including:  ? Medicines to shrink the prostate.  ? Medicines to relax the muscle of the prostate.  · Surgery in severe cases. Surgery may include:  ? Prostatectomy. In this procedure, the prostate tissue is removed completely through an open incision or with a laparascope or robotics.  ? Transurethral resection of the prostate (TURP). In this procedure, a tool is inserted through the opening at the tip of the penis (urethra). It is used to cut away tissue of   the inner core of the prostate. The pieces are removed through the same opening of the penis. This removes the blockage.  ? Transurethral incision (TUIP). In this procedure, small cuts are made in the prostate. This lessens the prostate's pressure on the urethra.  ? Transurethral microwave thermotherapy (TUMT). This procedure uses microwaves to create heat. The heat destroys and removes a small amount of prostate tissue.  ? Transurethral needle ablation (TUNA). This procedure uses radio frequencies to destroy and remove a small amount of  prostate tissue.  ? Interstitial laser coagulation (ILC). This procedure uses a laser to destroy and remove a small amount of prostate tissue.  ? Transurethral electrovaporization (TUVP). This procedure uses electrodes to destroy and remove a small amount of prostate tissue.  ? Prostatic urethral lift. This procedure inserts an implant to push the lobes of the prostate away from the urethra.    Follow these instructions at home:  · Take over-the-counter and prescription medicines only as told by your health care provider.  · Monitor your symptoms for any changes. Contact your health care provider with any changes.  · Avoid drinking large amounts of liquid before going to bed or out in public.  · Avoid or reduce how much caffeine or alcohol you drink.  · Give yourself time when you urinate.  · Keep all follow-up visits as told by your health care provider. This is important.  Contact a health care provider if:  · You have unexplained back pain.  · Your symptoms do not get better with treatment.  · You develop side effects from the medicine you are taking.  · Your urine becomes very dark or has a bad smell.  · Your lower abdomen becomes distended and you have trouble passing your urine.  Get help right away if:  · You have a fever or chills.  · You suddenly cannot urinate.  · You feel lightheaded, or very dizzy, or you faint.  · There are large amounts of blood or clots in the urine.  · Your urinary problems become hard to manage.  · You develop moderate to severe low back or flank pain. The flank is the side of your body between the ribs and the hip.  These symptoms may represent a serious problem that is an emergency. Do not wait to see if the symptoms will go away. Get medical help right away. Call your local emergency services (911 in the [place] Do not drive yourself to the hospital.  Summary  · Benign prostatic hyperplasia (BPH) is an enlarged prostate that is caused by the normal aging process and not by  cancer.  · An enlarged prostate can press on the urethra. This can make it hard to pass urine.  · This condition is part of a normal aging process and is more likely to develop in men over the age of 50 years.  · Get help right away if you suddenly cannot urinate.  This information is not intended to replace advice given to you by your health care provider. Make sure you discuss any questions you have with your health care provider.  Document Released: 10/04/2005 Document Revised: 11/08/2016 Document Reviewed: 11/08/2016  [employer] Interactive Patient Education © 2018 [employer]

## 2018-03-27 ENCOUNTER — Ambulatory Visit: Payer: Self-pay

## 2018-04-03 ENCOUNTER — Other Ambulatory Visit: Payer: Self-pay

## 2018-04-03 ENCOUNTER — Other Ambulatory Visit (INDEPENDENT_AMBULATORY_CARE_PROVIDER_SITE_OTHER): Payer: Self-pay | Admitting: Physician Assistant

## 2018-04-03 ENCOUNTER — Ambulatory Visit (INDEPENDENT_AMBULATORY_CARE_PROVIDER_SITE_OTHER): Payer: Self-pay | Admitting: Physician Assistant

## 2018-04-03 ENCOUNTER — Encounter (INDEPENDENT_AMBULATORY_CARE_PROVIDER_SITE_OTHER): Payer: Self-pay | Admitting: Physician Assistant

## 2018-04-03 VITALS — BP 130/76 | HR 70 | Temp 97.9°F | Ht 68.5 in | Wt 233.2 lb

## 2018-04-03 DIAGNOSIS — N400 Enlarged prostate without lower urinary tract symptoms: Secondary | ICD-10-CM

## 2018-04-03 DIAGNOSIS — R3911 Hesitancy of micturition: Secondary | ICD-10-CM | POA: Insufficient documentation

## 2018-04-03 DIAGNOSIS — N5089 Other specified disorders of the male genital organs: Secondary | ICD-10-CM

## 2018-04-03 DIAGNOSIS — N401 Enlarged prostate with lower urinary tract symptoms: Secondary | ICD-10-CM

## 2018-04-03 DIAGNOSIS — M109 Gout, unspecified: Secondary | ICD-10-CM

## 2018-04-03 DIAGNOSIS — R3912 Poor urinary stream: Secondary | ICD-10-CM

## 2018-04-03 MED ORDER — FINASTERIDE 5 MG PO TABS: 5.0000 mg | ORAL_TABLET | Freq: Every day | ORAL | 5 refills | Status: AC

## 2018-04-03 MED ORDER — TAMSULOSIN HCL 0.4 MG PO CAPS: 0.4000 mg | ORAL_CAPSULE | Freq: Every day | ORAL | 5 refills | Status: DC

## 2018-04-03 MED ORDER — NAPROXEN 500 MG PO TABS: 500.0000 mg | ORAL_TABLET | Freq: Two times a day (BID) | ORAL | 0 refills | Status: DC

## 2018-04-03 MED FILL — TAMSULOSIN HCL 0.4 MG CAP: 0.4 | 30 days supply | Qty: 30 | Fill #0

## 2018-04-03 MED FILL — NAPROXEN 500 MG TABLET: 500 | 15 days supply | Qty: 30 | Fill #0

## 2018-04-03 MED FILL — FINASTERIDE 5 MG TABLET: 5 | 30 days supply | Qty: 30 | Fill #0

## 2018-04-03 NOTE — Patient Instructions (Addendum)
Low-Purine Diet Purines are compounds that affect the level of uric acid in your body. A low-purine diet is a diet that is low in purines. Eating a low-purine diet can prevent the level of uric acid in your body from getting too high and causing gout or kidney stones or both. What do I need to know about this diet?  Choose low-purine foods. Examples of low-purine foods are listed in the next section.  Drink plenty of fluids, especially water. Fluids can help remove uric acid from your body. Try to drink 8-16 cups (1.9-3.8 L) a day.  Limit foods high in fat, especially saturated fat, as fat makes it harder for the body to get rid of uric acid. Foods high in saturated fat include pizza, cheese, ice cream, whole milk, fried foods, and gravies. Choose foods that are lower in fat and lean sources of protein. Use olive oil when cooking as it contains healthy fats that are not high in saturated fat.  Limit alcohol. Alcohol interferes with the elimination of uric acid from your body. If you are having a gout attack, avoid all alcohol.  Keep in mind that different people's bodies react differently to different foods. You will probably learn over time which foods do or do not affect you. If you discover that a food tends to cause your gout to flare up, avoid eating that food. You can more freely enjoy foods that do not cause problems. If you have any questions about a food item, talk to your dietitian or health care provider. Which foods are low, moderate, and high in purines? The following is a list of foods that are low, moderate, and high in purines. You can eat any amount of the foods that are low in purines. You may be able to have small amounts of foods that are moderate in purines. Ask your health care provider how much of a food moderate in purines you can have. Avoid foods high in purines. Grains  Foods low in purines: Enriched white bread, pasta, rice, cake, cornbread, popcorn.  Foods moderate in  purines: Whole-grain breads and cereals, wheat germ, bran, oatmeal. Uncooked oatmeal. Dry wheat bran or wheat germ.  Foods high in purines: Pancakes, French toast, biscuits, muffins. Vegetables  Foods low in purines: All vegetables, except those that are moderate in purines.  Foods moderate in purines: Asparagus, cauliflower, spinach, mushrooms, green peas. Fruits  All fruits are low in purines. Meats and other Protein Foods  Foods low in purines: Eggs, nuts, peanut butter.  Foods moderate in purines: 80-90% lean beef, lamb, veal, pork, poultry, fish, eggs, peanut butter, nuts. Crab, lobster, oysters, and shrimp. Cooked dried beans, peas, and lentils.  Foods high in purines: Anchovies, sardines, herring, mussels, tuna, codfish, scallops, trout, and haddock. Bacon. Organ meats (such as liver or kidney). Tripe. Game meat. Goose. Sweetbreads. Dairy  All dairy foods are low in purines. Low-fat and fat-free dairy products are best because they are low in saturated fat. Beverages  Drinks low in purines: Water, carbonated beverages, tea, coffee, cocoa.  Drinks moderate in purines: Soft drinks and other drinks sweetened with high-fructose corn syrup. Juices. To find whether a food or drink is sweetened with high-fructose corn syrup, look at the ingredients list.  Drinks high in purines: Alcoholic beverages (such as beer). Condiments  Foods low in purines: Salt, herbs, olives, pickles, relishes, vinegar.  Foods moderate in purines: Butter, margarine, oils, mayonnaise. Fats and Oils  Foods low in purines: All types, except gravies   and sauces made with meat.  Foods high in purines: Gravies and sauces made with meat. Other Foods  Foods low in purines: Sugars, sweets, gelatin. Cake. Soups made without meat.  Foods moderate in purines: Meat-based or fish-based soups, broths, or bouillons. Foods and drinks sweetened with high-fructose corn syrup.  Foods high in purines: High-fat desserts  (such as ice cream, cookies, cakes, pies, doughnuts, and chocolate). Contact your dietitian for more information on foods that are not listed here. This information is not intended to replace advice given to you by your health care provider. Make sure you discuss any questions you have with your health care provider. Document Released: 01/29/2011 Document Revised: 03/11/2016 Document Reviewed: 09/10/2013 Elsevier Interactive Patient Education  2017 [employer]    Benign Prostatic Hyperplasia Benign prostatic hyperplasia (BPH) is an enlarged prostate gland that is caused by the normal aging process and not by cancer. The prostate is a walnut-sized gland that is involved in the production of semen. It is located in front of the rectum and below the bladder. The bladder stores urine and the urethra is the tube that carries the urine out of the body. The prostate may get bigger as a man gets older. An enlarged prostate can press on the urethra. This can make it harder to pass urine. The build-up of urine in the bladder can cause infection. Back pressure and infection may progress to bladder damage and kidney (renal) failure. What are the causes? This condition is part of a normal aging process. However, not all men develop problems from this condition. If the prostate enlarges away from the urethra, urine flow will not be blocked. If it enlarges toward the urethra and compresses it, there will be problems passing urine. What increases the risk? This condition is more likely to develop in men over the age of 68 years. What are the signs or symptoms? Symptoms of this condition include:  Getting up often during the night to urinate.  Needing to urinate frequently during the day.  Difficulty starting urine flow.  Decrease in size and strength of your urine stream.  Leaking (dribbling) after urinating.  Inability to pass urine. This needs immediate treatment.  Inability to completely empty  your bladder.  Pain when you pass urine. This is more common if there is also an infection.  Urinary tract infection (UTI).  How is this diagnosed? This condition is diagnosed based on your medical history, a physical exam, and your symptoms. Tests will also be done, such as:  A post-void bladder scan. This measures any amount of urine that may remain in your bladder after you finish urinating.  A digital rectal exam. In a rectal exam, your health care provider checks your prostate by putting a lubricated, gloved finger into your rectum to feel the back of your prostate gland. This exam detects the size of your gland and any abnormal lumps or growths.  An exam of your urine (urinalysis).  A prostate specific antigen (PSA) screening. This is a blood test used to screen for prostate cancer.  An ultrasound. This test uses sound waves to electronically produce a picture of your prostate gland.  Your health care provider may refer you to a specialist in kidney and prostate diseases (urologist). How is this treated? Once symptoms begin, your health care provider will monitor your condition (active surveillance or watchful waiting). Treatment for this condition will depend on the severity of your condition. Treatment may include:  Observation and yearly exams. This may  be the only treatment needed if your condition and symptoms are mild.  Medicines to relieve your symptoms, including: ? Medicines to shrink the prostate. ? Medicines to relax the muscle of the prostate.  Surgery in severe cases. Surgery may include: ? Prostatectomy. In this procedure, the prostate tissue is removed completely through an open incision or with a laparascope or robotics. ? Transurethral resection of the prostate (TURP). In this procedure, a tool is inserted through the opening at the tip of the penis (urethra). It is used to cut away tissue of the inner core of the prostate. The pieces are removed through the same  opening of the penis. This removes the blockage. ? Transurethral incision (TUIP). In this procedure, small cuts are made in the prostate. This lessens the prostate's pressure on the urethra. ? Transurethral microwave thermotherapy (TUMT). This procedure uses microwaves to create heat. The heat destroys and removes a small amount of prostate tissue. ? Transurethral needle ablation (TUNA). This procedure uses radio frequencies to destroy and remove a small amount of prostate tissue. ? Interstitial laser coagulation (Sharon). This procedure uses a laser to destroy and remove a small amount of prostate tissue. ? Transurethral electrovaporization (TUVP). This procedure uses electrodes to destroy and remove a small amount of prostate tissue. ? Prostatic urethral lift. This procedure inserts an implant to push the lobes of the prostate away from the urethra.  Follow these instructions at home:  Take over-the-counter and prescription medicines only as told by your health care provider.  Monitor your symptoms for any changes. Contact your health care provider with any changes.  Avoid drinking large amounts of liquid before going to bed or out in public.  Avoid or reduce how much caffeine or alcohol you drink.  Give yourself time when you urinate.  Keep all follow-up visits as told by your health care provider. This is important. Contact a health care provider if:  You have unexplained back pain.  Your symptoms do not get better with treatment.  You develop side effects from the medicine you are taking.  Your urine becomes very dark or has a bad smell.  Your lower abdomen becomes distended and you have trouble passing your urine. Get help right away if:  You have a fever or chills.  You suddenly cannot urinate.  You feel lightheaded, or very dizzy, or you faint.  There are large amounts of blood or clots in the urine.  Your urinary problems become hard to manage.  You develop moderate  to severe low back or flank pain. The flank is the side of your body between the ribs and the hip. These symptoms may represent a serious problem that is an emergency. Do not wait to see if the symptoms will go away. Get medical help right away. Call your local emergency services (911 in the [place] Do not drive yourself to the hospital. Summary  Benign prostatic hyperplasia (BPH) is an enlarged prostate that is caused by the normal aging process and not by cancer.  An enlarged prostate can press on the urethra. This can make it hard to pass urine.  This condition is part of a normal aging process and is more likely to develop in men over the age of 29 years.  Get help right away if you suddenly cannot urinate. This information is not intended to replace advice given to you by your health care provider. Make sure you discuss any questions you have with your health care provider. Document Released: 10/04/2005  Document Revised: 11/08/2016 Document Reviewed: 11/08/2016 Elsevier Interactive Patient Education  [employer].

## 2018-04-03 NOTE — Progress Notes (Signed)
Subjective:  Patient ID: Carl Gardner, male    DOB: [birth year]  Age: 62 y.o. MRN: [id number]  CC: f/u scrotal pain  HPI Carl Molner Chalmersis a 62 y.o.malewith a medical history of BPH, HCV, HLD, and left shoulder surgery presents to f/u on scrotal pain and perineal pain. Pain is waxing and waning. Thought to have epididymitis and was prescribed Cipro, Tamsulosin, and Naproxen which which reduced pain. Was seen on f/u and had Bactrim, Tamsulosin, and Finasteride. Says his scrotal pain, perineal pain, and lower back pain resolved during the time he took Bactrim, Tamsulosin, and Finasteride. PSA 0.5ng/mL before use of finasteride.      Complains of acute gout episode on right hallux a few days ago. Drinks alcohol occasionally. Eats seafood regularly. Pain is nearly resolved now.    Outpatient Medications Prior to Visit  Medication Sig Dispense Refill  . tamsulosin (FLOMAX) 0.4 MG CAPS capsule Take 1 capsule (0.4 mg total) by mouth daily. 30 capsule 5  . finasteride (PROSCAR) 5 MG tablet Take 1 tablet (5 mg total) by mouth daily. (Patient not taking: Reported on 04/03/2018) 30 tablet 5  . naproxen (NAPROSYN) 500 MG tablet Take 1 tablet (500 mg total) by mouth 2 (two) times daily with a meal. (Patient not taking: Reported on 04/03/2018) 30 tablet 0  . sulfamethoxazole-trimethoprim (BACTRIM DS,SEPTRA DS) 800-160 MG tablet Take 1 tablet by mouth 2 (two) times daily. 20 tablet 0   No facility-administered medications prior to visit.      ROS Review of Systems  Constitutional: Negative for chills, fever and malaise/fatigue.  Eyes: Negative for blurred vision.  Respiratory: Negative for shortness of breath.   Cardiovascular: Negative for chest pain and palpitations.  Gastrointestinal: Negative for abdominal pain and nausea.  Genitourinary: Negative for dysuria and hematuria.  Musculoskeletal: Positive for joint pain. Negative for myalgias.  Skin: Negative for rash.  Neurological:  Negative for tingling and headaches.  Psychiatric/Behavioral: Negative for depression. The patient is not nervous/anxious.     Objective:  BP 130/76 (BP Location: Left Arm, Patient Position: Sitting, Cuff Size: Large)   Pulse 70   Temp 97.9 F (36.6 C) (Oral)   Ht 5' 8.5" (1.74 m)   Wt 233 lb 3.2 oz (105.8 kg)   SpO2 98%   BMI 34.94 kg/m   BP/Weight 04/03/2018 02/18/2705 11/20/7626  Systolic BP 315 176 160  Diastolic BP 76 72 95  Wt. (Lbs) 233.2 238 241.4  BMI 34.94 35.66 36.17      Physical Exam  Constitutional: He is oriented to person, place, and time.  Well developed, well nourished, NAD, polite  HENT:  Head: Normocephalic and atraumatic.  Eyes: No scleral icterus.  Neck: Normal range of motion. Neck supple. No thyromegaly present.  Cardiovascular: Normal rate, regular rhythm and normal heart sounds.  Pulmonary/Chest: Effort normal and breath sounds normal.  Genitourinary:  Genitourinary Comments: Mild TTP on the left testicle and spermatic cord.  Musculoskeletal: He exhibits no edema.  TTP along the MTP of right hallux  Neurological: He is alert and oriented to person, place, and time.  Skin: Skin is warm and dry. No rash noted. No erythema. No pallor.  Psychiatric: He has a normal mood and affect. His behavior is normal. Thought content normal.  Vitals reviewed.    Assessment & Plan:    1. Benign prostatic hyperplasia with weak urinary stream - Ambulatory referral to Urology - Continue with finasteride  2. Urinary hesitancy - Ambulatory referral to Urology  3.  Spermatic cord pain - Ambulatory referral to Urology - naproxen (NAPROSYN) 500 MG tablet; Take 1 tablet (500 mg total) by mouth 2 (two) times daily with a meal.  Dispense: 30 tablet; Refill: 0  4. Acute gout involving toe of right foot, unspecified cause - Acute phase resolved - Take Naproxen as needed - Low purine diet instructions printed and given to pt.   Meds ordered this encounter   Medications  . naproxen (NAPROSYN) 500 MG tablet    Sig: Take 1 tablet (500 mg total) by mouth 2 (two) times daily with a meal.    Dispense:  30 tablet    Refill:  0    Order Specific Question:   Supervising Provider    Answer:   Charlott Rakes [[id number]]    Follow-up: Return in about 1 month (around 05/01/2018) for gout.   Clent Demark PA

## 2018-08-03 ENCOUNTER — Encounter

## 2018-08-03 ENCOUNTER — Other Ambulatory Visit: Payer: Self-pay

## 2018-08-03 ENCOUNTER — Ambulatory Visit (INDEPENDENT_AMBULATORY_CARE_PROVIDER_SITE_OTHER): Payer: Self-pay | Admitting: Physician Assistant

## 2018-08-03 ENCOUNTER — Encounter (INDEPENDENT_AMBULATORY_CARE_PROVIDER_SITE_OTHER): Payer: Self-pay | Admitting: Physician Assistant

## 2018-08-03 VITALS — BP 138/84 | HR 63 | Temp 97.9°F | Ht 68.5 in | Wt 240.8 lb

## 2018-08-03 DIAGNOSIS — Z23 Encounter for immunization: Secondary | ICD-10-CM

## 2018-08-03 DIAGNOSIS — M109 Gout, unspecified: Secondary | ICD-10-CM

## 2018-08-03 MED ORDER — COLCHICINE 0.6 MG PO TABS: ORAL_TABLET | ORAL | 0 refills | Status: AC

## 2018-08-03 NOTE — Progress Notes (Signed)
Subjective:  Patient ID: Carl Gardner, male    DOB: [birth year]  Age: 62 y.o. MRN: [id number]  CC: gout  HPI   Carl Olivencia Chalmersis a 62 y.o.malewith a medical history of BPH, HCV, HLD, and left shoulder surgery presents with complaint of gout x4 days. Does not have a hx of gout but says he was told about symptoms of gout by a friend. MTP of right hallux has been exquisitely tender, red, and swollen. Says he was unable to walk yesterday due to pain. Says she was told to ask for Colchicine. Does not endorse injury, fever, chills, rash, other joint pain, myalgias, deformity, CP, palpitations, SOB, or GI/GU sxs.    Outpatient Medications Prior to Visit  Medication Sig Dispense Refill  . finasteride (PROSCAR) 5 MG tablet Take 1 tablet (5 mg total) by mouth daily. 30 tablet 5  . tamsulosin (FLOMAX) 0.4 MG CAPS capsule Take 1 capsule (0.4 mg total) by mouth daily. 30 capsule 5  . naproxen (NAPROSYN) 500 MG tablet Take 1 tablet (500 mg total) by mouth 2 (two) times daily with a meal. 30 tablet 0   No facility-administered medications prior to visit.      ROS Review of Systems  Constitutional: Negative for chills, fever and malaise/fatigue.  Eyes: Negative for blurred vision.  Respiratory: Negative for shortness of breath.   Cardiovascular: Negative for chest pain and palpitations.  Gastrointestinal: Negative for abdominal pain and nausea.  Genitourinary: Negative for dysuria and hematuria.  Musculoskeletal: Positive for joint pain. Negative for myalgias.  Skin: Negative for rash.  Neurological: Negative for tingling and headaches.  Psychiatric/Behavioral: Negative for depression. The patient is not nervous/anxious.     Objective:  BP 138/84 (BP Location: Left Arm, Patient Position: Sitting, Cuff Size: Large)   Pulse 63   Temp 97.9 F (36.6 C) (Oral)   Ht 5' 8.5" (1.74 m)   Wt 240 lb 12.8 oz (109.2 kg)   SpO2 93%   BMI 36.08 kg/m   BP/Weight 08/03/2018 04/03/2018  12/21/5730  Systolic BP 202 542 706  Diastolic BP 84 76 72  Wt. (Lbs) 240.8 233.2 238  BMI 36.08 34.94 35.66      Physical Exam  Constitutional: He is oriented to person, place, and time.  Well developed, well nourished, NAD, polite  HENT:  Head: Normocephalic and atraumatic.  Eyes: No scleral icterus.  Neck: Normal range of motion. Neck supple. No thyromegaly present.  Cardiovascular: Normal rate, regular rhythm and normal heart sounds.  Pulmonary/Chest: Effort normal and breath sounds normal.  Musculoskeletal: He exhibits no edema.  MTP of right hallux with mild edema and moderately TTP. No erythema, ecchymosis, or increased warmth.  Neurological: He is alert and oriented to person, place, and time.  Skin: Skin is warm and dry. No rash noted. No erythema. No pallor.  Psychiatric: He has a normal mood and affect. His behavior is normal. Thought content normal.  Vitals reviewed.    Assessment & Plan:   1. Acute gout involving toe of right foot, unspecified cause - Begin colchicine 0.6 MG tablet; Day one -Take two tablets at once, wait one hour and take another tablet. Day 2 and thereafter Take one tablet daily.  Dispense: 30 tablet; Refill: 0 - Advised to begin allopurinol after pain and inflammation resolve.   2. Need for prophylactic vaccination and inoculation against influenza - Flu Vaccine QUAD 6+ mos PF IM (Fluarix Quad PF)  3. Need for shingles vaccine - Varicella-zoster vaccine subcutaneous; Future  Meds ordered this encounter  Medications  . colchicine 0.6 MG tablet    Sig: Day one -Take two tablets at once, wait one hour and take another tablet. Day 2 and thereafter Take one tablet daily.    Dispense:  30 tablet    Refill:  0    Order Specific Question:   Supervising Provider    Answer:   Charlott Rakes [[id number]]    Follow-up: Return if symptoms worsen or fail to improve.   Clent Demark PA

## 2018-08-03 NOTE — Patient Instructions (Signed)

## 2018-08-04 MED FILL — !COLCRYS 0.6 MG TABLET: 0.6 MG | 10 days supply | Qty: 10 | Fill #0

## 2018-08-07 ENCOUNTER — Ambulatory Visit (INDEPENDENT_AMBULATORY_CARE_PROVIDER_SITE_OTHER): Payer: Self-pay

## 2018-08-07 DIAGNOSIS — Z23 Encounter for immunization: Secondary | ICD-10-CM

## 2018-08-07 NOTE — Progress Notes (Signed)
Nurse visit for vaccination.

## 2018-12-06 ENCOUNTER — Ambulatory Visit (INDEPENDENT_AMBULATORY_CARE_PROVIDER_SITE_OTHER): Payer: Self-pay

## 2018-12-12 ENCOUNTER — Encounter

## 2018-12-12 ENCOUNTER — Other Ambulatory Visit: Payer: Self-pay

## 2018-12-12 ENCOUNTER — Ambulatory Visit (INDEPENDENT_AMBULATORY_CARE_PROVIDER_SITE_OTHER): Payer: Self-pay | Admitting: Primary Care

## 2018-12-12 ENCOUNTER — Encounter (INDEPENDENT_AMBULATORY_CARE_PROVIDER_SITE_OTHER): Payer: Self-pay | Admitting: Primary Care

## 2018-12-12 VITALS — BP 127/82 | HR 71 | Temp 97.4°F | Ht 68.5 in | Wt 243.0 lb

## 2018-12-12 DIAGNOSIS — K047 Periapical abscess without sinus: Secondary | ICD-10-CM

## 2018-12-12 DIAGNOSIS — K029 Dental caries, unspecified: Secondary | ICD-10-CM

## 2018-12-12 NOTE — Progress Notes (Signed)
Established Patient Office Visit  Subjective:  Patient ID: Carl Gardner, male    DOB: 13-Nov-1955  Age: 63 y.o. MRN: [id number]  CC:  Chief Complaint  Patient presents with  . dental referral    HPI [name] presents for referral for dentistry.  Past medical history of BPH, HCV, HLD, and left shoulder surgery .  Complains of gum pain and bad taste in mouth will refer to denistry before infection occurs. Drinks alcohol occasionally. Pain is nearly resolved now.  Past Medical History:  Diagnosis Date  . BPH (benign prostatic hypertrophy)   . Hepatitis    Hep C  . History of chicken pox     Past Surgical History:  Procedure Laterality Date  . SHOULDER ARTHROSCOPY WITH DISTAL CLAVICLE RESECTION Left 06/30/2015   Procedure: SHOULDER ARTHROSCOPY WITH DISTAL CLAVICLE RESECTION; LABRAL TEAR DEBRIDEMENT;  Surgeon: Tania Ade, MD;  Location: [facility];  Service: Orthopedics;  Laterality: Left;  . SHOULDER ARTHROSCOPY WITH ROTATOR CUFF REPAIR AND SUBACROMIAL DECOMPRESSION Left 06/30/2015   Procedure: SHOULDER ARTHROSCOPY WITH ARTHROSCOPIC ROTATOR CUFF REPAIR, SUBACROMIAL DECOMPRESSION;  Surgeon: Tania Ade, MD;  Location: [facility];  Service: Orthopedics;  Laterality: Left;  . WRIST SURGERY Left     Family History  Adopted: Yes    Social History   Socioeconomic History  . Marital status: Legally Separated    Spouse name: Not on file  . Number of children: 2  . Years of education: 11  . Highest education level: Not on file  Occupational History  . Occupation: Caregiver  Social Needs  . Financial resource strain: Not on file  . Food insecurity:    Worry: Not on file    Inability: Not on file  . Transportation needs:    Medical: Not on file    Non-medical: Not on file  Tobacco Use  . Smoking status: Never Smoker  . Smokeless tobacco: Never Used  Substance and Sexual Activity  . Alcohol use: No    Comment: last use  friday 1200  . Drug use: Yes    Frequency: 3.0 times per week    Types: Marijuana  . Sexual activity: Not Currently  Lifestyle  . Physical activity:    Days per week: Not on file    Minutes per session: Not on file  . Stress: Not on file  Relationships  . Social connections:    Talks on phone: Not on file    Gets together: Not on file    Attends religious service: Not on file    Active member of club or organization: Not on file    Attends meetings of clubs or organizations: Not on file    Relationship status: Not on file  . Intimate partner violence:    Fear of current or ex partner: Not on file    Emotionally abused: Not on file    Physically abused: Not on file    Forced sexual activity: Not on file  Other Topics Concern  . Not on file  Social History Narrative   Fun: Take care of mom    Outpatient Medications Prior to Visit  Medication Sig Dispense Refill  . clindamycin (CLEOCIN) 300 MG capsule Take 300 mg by mouth 3 (three) times daily.    . colchicine 0.6 MG tablet Day one -Take two tablets at once, wait one hour and take another tablet. Day 2 and thereafter Take one tablet daily. 30 tablet 0  . finasteride (PROSCAR) 5  MG tablet Take 1 tablet (5 mg total) by mouth daily. 30 tablet 5  . tamsulosin (FLOMAX) 0.4 MG CAPS capsule Take 1 capsule (0.4 mg total) by mouth daily. (Patient not taking: Reported on 12/12/2018) 30 capsule 5   No facility-administered medications prior to visit.     Allergies  Allergen Reactions  . Penicillins Hives    ROS Review of Systems  Constitutional: Negative.   HENT: Positive for dental problem.   Eyes: Negative.   Respiratory: Negative.   Gastrointestinal: Negative.   Endocrine: Negative.   Genitourinary: Positive for frequency.  Musculoskeletal: Negative.   Skin: Negative.   Allergic/Immunologic: Negative.   Neurological: Negative.   Hematological: Negative.   Psychiatric/Behavioral: Negative.       Objective:     Physical Exam  BP 127/82 (BP Location: Left Arm, Patient Position: Sitting, Cuff Size: Normal)   Pulse 71   Temp (!) 97.4 F (36.3 C) (Oral)   Ht 5' 8.5" (1.74 m)   Wt 243 lb (110.2 kg)   SpO2 95%   BMI 36.41 kg/m    There are no preventive care reminders to display for this patient.  No results found for: TSH Lab Results  Component Value Date   WBC 5.7 01/23/2018   HGB 14.9 01/23/2018   HCT 44.0 01/23/2018   MCV 100 (H) 01/23/2018   PLT 203 01/23/2018   Lab Results  Component Value Date   NA 142 01/23/2018   K 4.4 01/23/2018   CO2 22 01/23/2018   GLUCOSE 103 (H) 01/23/2018   BUN 13 01/23/2018   CREATININE 1.13 01/23/2018   BILITOT 0.3 01/23/2018   ALKPHOS 88 01/23/2018   AST 24 01/23/2018   ALT 20 01/23/2018   PROT 7.3 01/23/2018   ALBUMIN 4.5 01/23/2018   CALCIUM 9.0 01/23/2018   ANIONGAP 6 06/27/2015   GFR 78.80 08/13/2016   Lab Results  Component Value Date   CHOL 178 08/13/2016   Lab Results  Component Value Date   HDL 41.80 08/13/2016   Lab Results  Component Value Date   LDLCALC 118 (H) 08/13/2016   Lab Results  Component Value Date   TRIG 95.0 08/13/2016   Lab Results  Component Value Date   CHOLHDL 4 08/13/2016   No results found for: HGBA1C    Assessment & Plan:  Fenton was seen today for dental referral.  Diagnoses and all orders for this visit:  Infected dental carries refer to dentist just had wisdom tooth removed on abt's . Questionable abscess on frontal tooth    Follow-up: No follow-ups on file.    Kerin Perna, NP

## 2020-01-07 ENCOUNTER — Telehealth: Payer: Self-pay | Admitting: General Practice

## 2020-01-07 NOTE — Telephone Encounter (Signed)
New pt, saw Terri Piedra in 2018 and is requesting a transfer or care. Mother is a patient here. Please advise.

## 2021-03-10 ENCOUNTER — Telehealth: Payer: Self-pay | Admitting: Primary Care

## 2021-03-10 NOTE — Telephone Encounter (Signed)
Patients friend called and was wondering if you would be able to accept him as a new pt. She is a patient of yours and her name is Myriam Jacobson. Please advise

## 2021-03-10 NOTE — Telephone Encounter (Signed)
Ok with me, thanks.

## 2021-04-09 ENCOUNTER — Ambulatory Visit: Payer: BLUE CROSS/BLUE SHIELD | Admitting: Internal Medicine

## 2021-04-09 ENCOUNTER — Other Ambulatory Visit: Payer: Self-pay

## 2021-05-07 ENCOUNTER — Other Ambulatory Visit: Payer: Self-pay

## 2021-05-07 ENCOUNTER — Encounter: Payer: Self-pay | Admitting: Internal Medicine

## 2021-05-07 ENCOUNTER — Ambulatory Visit (INDEPENDENT_AMBULATORY_CARE_PROVIDER_SITE_OTHER): Payer: BC Managed Care – PPO | Admitting: Internal Medicine

## 2021-05-07 VITALS — BP 134/90 | HR 70 | Temp 98.0°F | Resp 18 | Ht 69.0 in | Wt 248.2 lb

## 2021-05-07 DIAGNOSIS — E785 Hyperlipidemia, unspecified: Secondary | ICD-10-CM | POA: Insufficient documentation

## 2021-05-07 DIAGNOSIS — M109 Gout, unspecified: Secondary | ICD-10-CM | POA: Diagnosis not present

## 2021-05-07 DIAGNOSIS — R3912 Poor urinary stream: Secondary | ICD-10-CM

## 2021-05-07 DIAGNOSIS — B182 Chronic viral hepatitis C: Secondary | ICD-10-CM | POA: Diagnosis not present

## 2021-05-07 DIAGNOSIS — Z0001 Encounter for general adult medical examination with abnormal findings: Secondary | ICD-10-CM | POA: Diagnosis not present

## 2021-05-07 DIAGNOSIS — F1411 Cocaine abuse, in remission: Secondary | ICD-10-CM

## 2021-05-07 DIAGNOSIS — N401 Enlarged prostate with lower urinary tract symptoms: Secondary | ICD-10-CM

## 2021-05-07 DIAGNOSIS — E538 Deficiency of other specified B group vitamins: Secondary | ICD-10-CM

## 2021-05-07 DIAGNOSIS — K921 Melena: Secondary | ICD-10-CM

## 2021-05-07 DIAGNOSIS — R3911 Hesitancy of micturition: Secondary | ICD-10-CM

## 2021-05-07 DIAGNOSIS — E559 Vitamin D deficiency, unspecified: Secondary | ICD-10-CM

## 2021-05-07 DIAGNOSIS — R739 Hyperglycemia, unspecified: Secondary | ICD-10-CM

## 2021-05-07 DIAGNOSIS — L989 Disorder of the skin and subcutaneous tissue, unspecified: Secondary | ICD-10-CM

## 2021-05-07 HISTORY — DX: Cocaine abuse, in remission: F14.11

## 2021-05-07 HISTORY — DX: Hyperlipidemia, unspecified: E78.5

## 2021-05-07 HISTORY — DX: Vitamin D deficiency, unspecified: E55.9

## 2021-05-07 LAB — CBC WITH DIFFERENTIAL/PLATELET
Basophils Absolute: 0 10*3/uL (ref 0.0–0.1)
Basophils Relative: 0.3 % (ref 0.0–3.0)
Eosinophils Absolute: 0.1 10*3/uL (ref 0.0–0.7)
Eosinophils Relative: 1.3 % (ref 0.0–5.0)
HCT: 42.2 % (ref 39.0–52.0)
Hemoglobin: 14.2 g/dL (ref 13.0–17.0)
Lymphocytes Relative: 43 % (ref 12.0–46.0)
Lymphs Abs: 2.3 10*3/uL (ref 0.7–4.0)
MCHC: 33.7 g/dL (ref 30.0–36.0)
MCV: 101.6 fl — ABNORMAL HIGH (ref 78.0–100.0)
Monocytes Absolute: 0.6 10*3/uL (ref 0.1–1.0)
Monocytes Relative: 10.7 % (ref 3.0–12.0)
Neutro Abs: 2.4 10*3/uL (ref 1.4–7.7)
Neutrophils Relative %: 44.7 % (ref 43.0–77.0)
Platelets: 175 10*3/uL (ref 150.0–400.0)
RBC: 4.15 Mil/uL — ABNORMAL LOW (ref 4.22–5.81)
RDW: 13.1 % (ref 11.5–15.5)
WBC: 5.4 10*3/uL (ref 4.0–10.5)

## 2021-05-07 LAB — BASIC METABOLIC PANEL
BUN: 18 mg/dL (ref 6–23)
CO2: 26 mEq/L (ref 19–32)
Calcium: 9 mg/dL (ref 8.4–10.5)
Chloride: 105 mEq/L (ref 96–112)
Creatinine, Ser: 1.21 mg/dL (ref 0.40–1.50)
GFR: 63.4 mL/min (ref >60.00)
Glucose, Bld: 88 mg/dL (ref 70–99)
Potassium: 4.2 mEq/L (ref 3.5–5.1)
Sodium: 140 mEq/L (ref 135–145)

## 2021-05-07 LAB — HEPATIC FUNCTION PANEL
ALT: 23 U/L (ref 0–53)
AST: 21 U/L (ref 0–37)
Albumin: 4.3 g/dL (ref 3.5–5.2)
Alkaline Phosphatase: 84 U/L (ref 39–117)
Bilirubin, Direct: 0.1 mg/dL (ref 0.0–0.3)
Total Bilirubin: 0.6 mg/dL (ref 0.2–1.2)
Total Protein: 7 g/dL (ref 6.0–8.3)

## 2021-05-07 LAB — LIPID PANEL
Cholesterol: 192 mg/dL (ref 0–200)
HDL: 40.6 mg/dL (ref >39.00)
LDL Cholesterol: 113 mg/dL — ABNORMAL HIGH (ref 0–99)
NonHDL: 151.5
Total CHOL/HDL Ratio: 5
Triglycerides: 195 mg/dL — ABNORMAL HIGH (ref 0.0–149.0)
VLDL: 39 mg/dL (ref 0.0–40.0)

## 2021-05-07 LAB — URINALYSIS, ROUTINE W REFLEX MICROSCOPIC
Bilirubin Urine: NEGATIVE
Ketones, ur: NEGATIVE
Leukocytes,Ua: NEGATIVE
Nitrite: NEGATIVE
Specific Gravity, Urine: >=1.03 — AB (ref 1.000–1.030)
Total Protein, Urine: NEGATIVE
Urine Glucose: NEGATIVE
Urobilinogen, UA: 0.2 (ref 0.0–1.0)
pH: 6 (ref 5.0–8.0)

## 2021-05-07 LAB — VITAMIN B12: Vitamin B-12: 386 pg/mL (ref 211–911)

## 2021-05-07 LAB — VITAMIN D 25 HYDROXY (VIT D DEFICIENCY, FRACTURES): VITD: 29.93 ng/mL — ABNORMAL LOW (ref 30.00–100.00)

## 2021-05-07 LAB — URIC ACID: Uric Acid, Serum: 6.4 mg/dL (ref 4.0–7.8)

## 2021-05-07 LAB — HEMOGLOBIN A1C: Hgb A1c MFr Bld: 5.9 % (ref 4.6–6.5)

## 2021-05-07 LAB — PSA: PSA: 0.42 ng/mL (ref 0.10–4.00)

## 2021-05-07 LAB — TSH: TSH: 1.77 u[IU]/mL (ref 0.35–5.50)

## 2021-05-07 MED ORDER — INDOMETHACIN 50 MG PO CAPS: 50.0000 mg | ORAL_CAPSULE | Freq: Three times a day (TID) | ORAL | 3 refills | Status: DC | PRN

## 2021-05-07 MED ORDER — TAMSULOSIN HCL 0.4 MG PO CAPS: 0.4000 mg | ORAL_CAPSULE | Freq: Every day | ORAL | 3 refills | Status: DC

## 2021-05-07 NOTE — Patient Instructions (Addendum)
Please take all new medication as prescribed - the flomax, and indocin as needed  Please continue all other medications as before, and refills have been done if requested.  Please have the pharmacy call with any other refills you may need.  Please continue your efforts at being more active, low cholesterol diet, and weight control.  You are otherwise up to date with prevention measures today.  Please keep your appointments with your specialists as you may have planned  You will be contacted regarding the referral for: colonoscopy, and Dermatology  Please go to the LAB at the blood drawing area for the tests to be done  You will be contacted by phone if any changes need to be made immediately.  Otherwise, you will receive a letter about your results with an explanation, but please check with MyChart first.  Please remember to sign up for MyChart if you have not done so, as this will be important to you in the future with finding out test results, communicating by private email, and scheduling acute appointments online when needed.  Please make an Appointment to return for your 1 year visit, or sooner if needed

## 2021-05-07 NOTE — Progress Notes (Signed)
Patient ID: Carl Gardner, male   DOB: 1956-01-12, 65 y.o.   MRN: [id number]         Chief Complaint:: wellness exam and New Patient (Initial Visit) (He would like to restart Flomax. He would like to discuss his tags when he wipes from a bowel movement. He also has glass to his right foot. ) , gout, right scalp lesion, bph  and hematochezia       HPI:  Carl Gardner is a 65 y.o. male here for wellness exam; declines covid vax and cologuard, o/w up to date with preventive referrals and immunizations                        Also had episode x 1 of small volume brbor with wiping tissue about 2 wks ago, and non since then.  Denies worsening reflux, abd pain, dysphagia, n/v, bowel change or blood.  Asks to restart flomax as he has ongoing urinary slowing and incomplete urination; also has gout episode to right first mtp recurring several times this yr already, and also has worsening right post scalp lesion scaly enlarging in the past 6 mo.  Pt denies chest pain, increased sob or doe, wheezing, orthopnea, PND, increased LE swelling, palpitations, dizziness or syncope.   Pt denies polydipsia, polyuria, or new focal neuro s/s.   Pt denies fever, wt loss, night sweats, loss of appetite, or other constitutional symptoms        Wt Readings from Last 3 Encounters:  05/07/21 248 lb 3.2 oz (112.6 kg)  12/12/18 243 lb (110.2 kg)  08/03/18 240 lb 12.8 oz (109.2 kg)   BP Readings from Last 3 Encounters:  05/07/21 134/90  12/12/18 127/82  08/03/18 138/84   Immunization History  Administered Date(s) Administered   Hepatitis B, adult 05/10/2017   Influenza,inj,Quad PF,6+ Mos 08/12/2016, 08/01/2017, 08/03/2018   PFIZER(Purple Top)SARS-COV-2 Vaccination 11/19/2019, 12/10/2019, 07/21/2020   Pneumococcal Polysaccharide-23 09/22/2016   Tdap 08/12/2016   Zoster Recombinat (Shingrix) 11/18/2020, 01/16/2021   Zoster, Live 08/07/2018  There are no preventive care reminders to display for this patient.     Past Medical History:  Diagnosis Date   BPH (benign prostatic hypertrophy)    Gout    Hepaitis C    Hep C   History of cocaine abuse ([place]) 05/07/2021   HLD (hyperlipidemia) 05/07/2021   Vitamin D deficiency 05/07/2021   Past Surgical History:  Procedure Laterality Date   SHOULDER ARTHROSCOPY WITH DISTAL CLAVICLE RESECTION Left 06/30/2015   Procedure: SHOULDER ARTHROSCOPY WITH DISTAL CLAVICLE RESECTION; LABRAL TEAR DEBRIDEMENT;  Surgeon: Tania Ade, MD;  Location: [facility];  Service: Orthopedics;  Laterality: Left;   SHOULDER ARTHROSCOPY WITH ROTATOR CUFF REPAIR AND SUBACROMIAL DECOMPRESSION Left 06/30/2015   Procedure: SHOULDER ARTHROSCOPY WITH ARTHROSCOPIC ROTATOR CUFF REPAIR, SUBACROMIAL DECOMPRESSION;  Surgeon: Tania Ade, MD;  Location: [facility];  Service: Orthopedics;  Laterality: Left;   WRIST SURGERY Left     reports that he has never smoked. He has never used smokeless tobacco. He reports current alcohol use. He reports current drug use. Frequency: 3.00 times per week. Drug: Marijuana. family history is not on file. He was adopted. Allergies  Allergen Reactions   Penicillins Hives   Current Outpatient Medications on File Prior to Visit  Medication Sig Dispense Refill   clindamycin (CLEOCIN) 300 MG capsule Take 300 mg by mouth 3 (three) times daily. (Patient not taking: Reported on 05/07/2021)     colchicine  0.6 MG tablet Day one -Take two tablets at once, wait one hour and take another tablet. Day 2 and thereafter Take one tablet daily. (Patient not taking: Reported on 05/07/2021) 30 tablet 0   finasteride (PROSCAR) 5 MG tablet Take 1 tablet (5 mg total) by mouth daily. (Patient not taking: Reported on 05/07/2021) 30 tablet 5   No current facility-administered medications on file prior to visit.        ROS:  All others reviewed and negative.  Objective        PE:  BP 134/90   Pulse 70   Temp 98 F (36.7 C) (Oral)   Resp 18   Ht  5\' 9"  (1.753 m)   Wt 248 lb 3.2 oz (112.6 kg)   SpO2 98%   BMI 36.65 kg/m                 Constitutional: Pt appears in NAD               HENT: Head: NCAT.                Right Ear: External ear normal.                 Left Ear: External ear normal.                Eyes: . Pupils are equal, round, and reactive to light. Conjunctivae and EOM are normal               Nose: without d/c or deformity               Neck: Neck supple. Gross normal ROM               Cardiovascular: Normal rate and regular rhythm.                 Pulmonary/Chest: Effort normal and breath sounds without rales or wheezing.                Abd:  Soft, NT, ND, + BS, no organomegaly               Neurological: Pt is alert. At baseline orientation, motor grossly intact               Skin: Skin is warm. No rashes, + post right scalp lesion new lesions, LE edema - none; right first mtp with 1+ red, tender, swelling               Psychiatric: Pt behavior is normal without agitation   Micro: none  Cardiac tracings I have personally interpreted today:  none  Pertinent Radiological findings (summarize): none   Lab Results  Component Value Date   WBC 5.4 05/07/2021   HGB 14.2 05/07/2021   HCT 42.2 05/07/2021   PLT 175.0 05/07/2021   GLUCOSE 88 05/07/2021   CHOL 192 05/07/2021   TRIG 195.0 (H) 05/07/2021   HDL 40.60 05/07/2021   LDLCALC 113 (H) 05/07/2021   ALT 23 05/07/2021   AST 21 05/07/2021   NA 140 05/07/2021   K 4.2 05/07/2021   CL 105 05/07/2021   CREATININE 1.21 05/07/2021   BUN 18 05/07/2021   CO2 26 05/07/2021   TSH 1.77 05/07/2021   PSA 0.42 05/07/2021   INR 0.9 09/22/2016   HGBA1C 5.9 05/07/2021   Assessment/Plan:  Carl Gardner is a 65 y.o. Black or African American [2] male with  has a past medical history  of BPH (benign prostatic hypertrophy), Gout, Hepaitis C, History of cocaine abuse ([place]) (05/07/2021), HLD (hyperlipidemia) (05/07/2021), and Vitamin D deficiency (05/07/2021).  Encounter  for routine adult health examination with abnormal findings Age and sex appropriate education and counseling updated with regular exercise and diet Referrals for preventative services - declines cologuard, but will do colonoscopy Immunizations addressed - decines covid vax Smoking counseling  - none needed Evidence for depression or other mood disorder - none significant Most recent labs reviewed. I have personally reviewed and have noted: 1) the patient's medical and social history 2) The patient's current medications and supplements 3) The patient's height, weight, and BMI have been recorded in the chart   Vitamin D deficiency Last vitamin D Lab Results  Component Value Date   VD25OH 29.93 (L) 05/07/2021   Low, to start oral replacement  Hematochezia Mild, etiology unclear, for colonoscopy  Benign prostatic hyperplasia with weak urinary stream Mild, for restart flonax,  to f/u any worsening symptoms or concerns  Acute gout involving toe of right foot Mild uncontrolled recurrent, for indocin prn,  to f/u any worsening symptoms or concerns  Chronic 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  Cathlean Cower, MD 05/09/2021 9:46 PM [facility]

## 2021-05-09 ENCOUNTER — Encounter: Payer: Self-pay | Admitting: Internal Medicine

## 2021-05-09 DIAGNOSIS — L989 Disorder of the skin and subcutaneous tissue, unspecified: Secondary | ICD-10-CM | POA: Insufficient documentation

## 2021-05-09 LAB — HCV RNA QUANT: Hepatitis C Quantitation: NOT DETECTED IU/mL

## 2021-05-09 NOTE — Assessment & Plan Note (Signed)
Mild uncontrolled recurrent, for indocin prn,  to f/u any worsening symptoms or concerns

## 2021-05-09 NOTE — Assessment & Plan Note (Signed)
Age and sex appropriate education and counseling updated with regular exercise and diet Referrals for preventative services - declines cologuard, but will do colonoscopy Immunizations addressed - decines covid vax Smoking counseling  - none needed Evidence for depression or other mood disorder - none significant Most recent labs reviewed. I have personally reviewed and have noted: 1) the patient's medical and social history 2) The patient's current medications and supplements 3) The patient's height, weight, and BMI have been recorded in the chart

## 2021-05-09 NOTE — Assessment & Plan Note (Signed)
For quant RNA hep c - r/o ongoing infection

## 2021-05-09 NOTE — Assessment & Plan Note (Signed)
Last vitamin D Lab Results  Component Value Date   VD25OH 29.93 (L) 05/07/2021   Low, to start oral replacement  

## 2021-05-09 NOTE — Assessment & Plan Note (Signed)
Mild, for restart flonax,  to f/u any worsening symptoms or concerns

## 2021-05-09 NOTE — Assessment & Plan Note (Signed)
For derm referral

## 2021-05-09 NOTE — Assessment & Plan Note (Signed)
Mild, etiology unclear, for colonoscopy

## 2021-05-25 ENCOUNTER — Telehealth: Payer: Self-pay | Admitting: Internal Medicine

## 2021-05-25 NOTE — Telephone Encounter (Signed)
Patient received letter from Dr. Jenny Reichmann about taking Crestor.  Patient verbally agrees he is okay w/ starting this new medication & would like it sent to pharmacy:  [facility] ([place]), [place] - [address]  Phone:  [phone] Fax:  [phone]   Also would like a callback once sent: [phone]

## 2021-05-26 MED ORDER — ROSUVASTATIN CALCIUM 20 MG PO TABS: 20.0000 mg | ORAL_TABLET | Freq: Every day | ORAL | 3 refills | Status: DC

## 2021-05-26 NOTE — Telephone Encounter (Signed)
Ok this is done 

## 2021-08-05 ENCOUNTER — Ambulatory Visit: Payer: BC Managed Care – PPO | Admitting: Gastroenterology

## 2021-08-28 ENCOUNTER — Ambulatory Visit: Payer: BC Managed Care – PPO | Admitting: Gastroenterology

## 2021-09-01 ENCOUNTER — Encounter: Payer: Self-pay | Admitting: Internal Medicine

## 2021-11-18 ENCOUNTER — Ambulatory Visit: Payer: BC Managed Care – PPO | Admitting: Dermatology

## 2022-05-10 ENCOUNTER — Encounter: Payer: BC Managed Care – PPO | Admitting: Internal Medicine

## 2022-05-20 ENCOUNTER — Ambulatory Visit (INDEPENDENT_AMBULATORY_CARE_PROVIDER_SITE_OTHER): Payer: Medicare HMO | Admitting: Internal Medicine

## 2022-05-20 ENCOUNTER — Encounter: Payer: Self-pay | Admitting: Internal Medicine

## 2022-05-20 ENCOUNTER — Ambulatory Visit (INDEPENDENT_AMBULATORY_CARE_PROVIDER_SITE_OTHER): Payer: Medicare HMO

## 2022-05-20 VITALS — BP 108/64 | HR 53 | Temp 98.2°F | Ht 69.0 in | Wt 246.0 lb

## 2022-05-20 DIAGNOSIS — E538 Deficiency of other specified B group vitamins: Secondary | ICD-10-CM

## 2022-05-20 DIAGNOSIS — E559 Vitamin D deficiency, unspecified: Secondary | ICD-10-CM | POA: Diagnosis not present

## 2022-05-20 DIAGNOSIS — R739 Hyperglycemia, unspecified: Secondary | ICD-10-CM

## 2022-05-20 DIAGNOSIS — E611 Iron deficiency: Secondary | ICD-10-CM | POA: Diagnosis not present

## 2022-05-20 DIAGNOSIS — M25561 Pain in right knee: Secondary | ICD-10-CM

## 2022-05-20 DIAGNOSIS — E7800 Pure hypercholesterolemia, unspecified: Secondary | ICD-10-CM | POA: Diagnosis not present

## 2022-05-20 DIAGNOSIS — M25461 Effusion, right knee: Secondary | ICD-10-CM | POA: Diagnosis not present

## 2022-05-20 DIAGNOSIS — M1811 Unilateral primary osteoarthritis of first carpometacarpal joint, right hand: Secondary | ICD-10-CM | POA: Diagnosis not present

## 2022-05-20 DIAGNOSIS — Z0001 Encounter for general adult medical examination with abnormal findings: Secondary | ICD-10-CM

## 2022-05-20 DIAGNOSIS — R079 Chest pain, unspecified: Secondary | ICD-10-CM | POA: Insufficient documentation

## 2022-05-20 DIAGNOSIS — M1812 Unilateral primary osteoarthritis of first carpometacarpal joint, left hand: Secondary | ICD-10-CM | POA: Diagnosis not present

## 2022-05-20 DIAGNOSIS — M25531 Pain in right wrist: Secondary | ICD-10-CM

## 2022-05-20 DIAGNOSIS — Z1211 Encounter for screening for malignant neoplasm of colon: Secondary | ICD-10-CM

## 2022-05-20 DIAGNOSIS — Z125 Encounter for screening for malignant neoplasm of prostate: Secondary | ICD-10-CM

## 2022-05-20 DIAGNOSIS — M25532 Pain in left wrist: Secondary | ICD-10-CM

## 2022-05-20 LAB — HEMOGLOBIN A1C: Hgb A1c MFr Bld: 6.1 % (ref 4.6–6.5)

## 2022-05-20 LAB — BASIC METABOLIC PANEL
BUN: 18 mg/dL (ref 6–23)
CO2: 29 mEq/L (ref 19–32)
Calcium: 9.1 mg/dL (ref 8.4–10.5)
Chloride: 103 mEq/L (ref 96–112)
Creatinine, Ser: 1.27 mg/dL (ref 0.40–1.50)
GFR: 59.39 mL/min — ABNORMAL LOW (ref >60.00)
Glucose, Bld: 81 mg/dL (ref 70–99)
Potassium: 4.1 mEq/L (ref 3.5–5.1)
Sodium: 139 mEq/L (ref 135–145)

## 2022-05-20 LAB — LIPID PANEL
Cholesterol: 117 mg/dL (ref 0–200)
HDL: 44.6 mg/dL (ref >39.00)
LDL Cholesterol: 42 mg/dL (ref 0–99)
NonHDL: 72.09
Total CHOL/HDL Ratio: 3
Triglycerides: 151 mg/dL — ABNORMAL HIGH (ref 0.0–149.0)
VLDL: 30.2 mg/dL (ref 0.0–40.0)

## 2022-05-20 LAB — URINALYSIS, ROUTINE W REFLEX MICROSCOPIC
Bilirubin Urine: NEGATIVE
Hgb urine dipstick: NEGATIVE
Ketones, ur: NEGATIVE
Leukocytes,Ua: NEGATIVE
Nitrite: NEGATIVE
Specific Gravity, Urine: 1.015 (ref 1.000–1.030)
Total Protein, Urine: NEGATIVE
Urine Glucose: NEGATIVE
Urobilinogen, UA: 0.2 (ref 0.0–1.0)
pH: 6.5 (ref 5.0–8.0)

## 2022-05-20 LAB — TSH: TSH: 1.25 u[IU]/mL (ref 0.35–5.50)

## 2022-05-20 LAB — HEPATIC FUNCTION PANEL
ALT: 27 U/L (ref 0–53)
AST: 20 U/L (ref 0–37)
Albumin: 4.4 g/dL (ref 3.5–5.2)
Alkaline Phosphatase: 76 U/L (ref 39–117)
Bilirubin, Direct: 0.1 mg/dL (ref 0.0–0.3)
Total Bilirubin: 0.5 mg/dL (ref 0.2–1.2)
Total Protein: 7.1 g/dL (ref 6.0–8.3)

## 2022-05-20 LAB — CBC WITH DIFFERENTIAL/PLATELET
Basophils Absolute: 0 10*3/uL (ref 0.0–0.1)
Basophils Relative: 0.3 % (ref 0.0–3.0)
Eosinophils Absolute: 0.1 10*3/uL (ref 0.0–0.7)
Eosinophils Relative: 1.2 % (ref 0.0–5.0)
HCT: 43 % (ref 39.0–52.0)
Hemoglobin: 14.3 g/dL (ref 13.0–17.0)
Lymphocytes Relative: 43.4 % (ref 12.0–46.0)
Lymphs Abs: 2.3 10*3/uL (ref 0.7–4.0)
MCHC: 33.4 g/dL (ref 30.0–36.0)
MCV: 100.9 fl — ABNORMAL HIGH (ref 78.0–100.0)
Monocytes Absolute: 0.6 10*3/uL (ref 0.1–1.0)
Monocytes Relative: 10.9 % (ref 3.0–12.0)
Neutro Abs: 2.3 10*3/uL (ref 1.4–7.7)
Neutrophils Relative %: 44.2 % (ref 43.0–77.0)
Platelets: 174 10*3/uL (ref 150.0–400.0)
RBC: 4.26 Mil/uL (ref 4.22–5.81)
RDW: 13.7 % (ref 11.5–15.5)
WBC: 5.3 10*3/uL (ref 4.0–10.5)

## 2022-05-20 LAB — VITAMIN D 25 HYDROXY (VIT D DEFICIENCY, FRACTURES): VITD: 45.78 ng/mL (ref 30.00–100.00)

## 2022-05-20 LAB — IBC PANEL
Iron: 107 ug/dL (ref 42–165)
Saturation Ratios: 26.1 % (ref 20.0–50.0)
TIBC: 410.2 ug/dL (ref 250.0–450.0)
Transferrin: 293 mg/dL (ref 212.0–360.0)

## 2022-05-20 LAB — FERRITIN: Ferritin: 115.3 ng/mL (ref 22.0–322.0)

## 2022-05-20 LAB — PSA: PSA: 0.37 ng/mL (ref 0.10–4.00)

## 2022-05-20 LAB — VITAMIN B12: Vitamin B-12: 537 pg/mL (ref 211–911)

## 2022-05-20 NOTE — Progress Notes (Signed)
Patient ID: Carl Gardner, male   DOB: May 05, 1956, 66 y.o.   MRN: [id number]         Chief Complaint:: wellness exam and Annual Exam, Edema (Both hands, throbbing and aching), Knee Pain, and Rectal Bleeding         HPI:  Carl Gardner is a 66 y.o. male here for wellness exam; declines covid booster, pneumovax, cologuard but ok for colonoscopy, ok for prevnar 20 at the pharmacy, o/w up to date                        Also not taking Vit D.  Has not really been working on lower chol diet but willing to start, does not want to take increased statin.  Pt denies  increased sob or doe, wheezing, orthopnea, PND, increased LE swelling, palpitations, dizziness or syncope, but does have a tender sore area chest pain to the left upper parasternal area without swelling or rash.   Pt denies polydipsia, polyuria, or new focal neuro s/s.    Pt denies fever, wt loss, night sweats, loss of appetite, or other constitutional symptoms  Does have bilateral hand and wrist arthritis pain and bilateral knee pain right > left, mild with trace intermittent swelling but no knee giveaways or falls.  Denies worsening reflux, abd pain, dysphagia, n/v, bowel change or blood except for one episode trace hematochezia 1 wk ago without pain.  Due for f/u lab today   Wt Readings from Last 3 Encounters:  05/20/22 246 lb (111.6 kg)  05/07/21 248 lb 3.2 oz (112.6 kg)  12/12/18 243 lb (110.2 kg)   BP Readings from Last 3 Encounters:  05/20/22 108/64  05/07/21 134/90  12/12/18 127/82   Immunization History  Administered Date(s) Administered   Hepatitis B, adult 05/10/2017   Influenza,inj,Quad PF,6+ Mos 08/12/2016, 08/01/2017, 08/03/2018   PFIZER(Purple Top)SARS-COV-2 Vaccination 11/19/2019, 12/10/2019, 07/21/2020   Pneumococcal Polysaccharide-23 09/22/2016   Tdap 08/12/2016   Zoster Recombinat (Shingrix) 11/18/2020, 01/16/2021   Zoster, Live 08/07/2018   There are no preventive care reminders to display for this  patient.     Past Medical History:  Diagnosis Date   BPH (benign prostatic hypertrophy)    Gout    Hepaitis C    Hep C   History of cocaine abuse ([place]) 05/07/2021   HLD (hyperlipidemia) 05/07/2021   Vitamin D deficiency 05/07/2021   Past Surgical History:  Procedure Laterality Date   SHOULDER ARTHROSCOPY WITH DISTAL CLAVICLE RESECTION Left 06/30/2015   Procedure: SHOULDER ARTHROSCOPY WITH DISTAL CLAVICLE RESECTION; LABRAL TEAR DEBRIDEMENT;  Surgeon: Tania Ade, MD;  Location: [facility];  Service: Orthopedics;  Laterality: Left;   SHOULDER ARTHROSCOPY WITH ROTATOR CUFF REPAIR AND SUBACROMIAL DECOMPRESSION Left 06/30/2015   Procedure: SHOULDER ARTHROSCOPY WITH ARTHROSCOPIC ROTATOR CUFF REPAIR, SUBACROMIAL DECOMPRESSION;  Surgeon: Tania Ade, MD;  Location: [facility];  Service: Orthopedics;  Laterality: Left;   WRIST SURGERY Left     reports that he has never smoked. He has never used smokeless tobacco. He reports current alcohol use. He reports current drug use. Frequency: 3.00 times per week. Drug: Marijuana. family history is not on file. He was adopted. Allergies  Allergen Reactions   Penicillins Hives   Current Outpatient Medications on File Prior to Visit  Medication Sig Dispense Refill   clindamycin (CLEOCIN) 300 MG capsule Take 300 mg by mouth 3 (three) times daily.     colchicine 0.6 MG tablet Day one -Take  two tablets at once, wait one hour and take another tablet. Day 2 and thereafter Take one tablet daily. 30 tablet 0   finasteride (PROSCAR) 5 MG tablet Take 1 tablet (5 mg total) by mouth daily. 30 tablet 5   indomethacin (INDOCIN) 50 MG capsule Take 1 capsule (50 mg total) by mouth 3 (three) times daily as needed (gout). 90 capsule 3   rosuvastatin (CRESTOR) 20 MG tablet Take 1 tablet (20 mg total) by mouth daily. 90 tablet 3   tamsulosin (FLOMAX) 0.4 MG CAPS capsule Take 1 capsule (0.4 mg total) by mouth daily. 90 capsule 3   No  current facility-administered medications on file prior to visit.        ROS:  All others reviewed and negative.  Objective        PE:  BP 108/64 (BP Location: Left Arm, Patient Position: Sitting, Cuff Size: Large)   Pulse (!) 53   Temp 98.2 F (36.8 C) (Oral)   Ht '5\' 9"'$  (1.753 m)   Wt 246 lb (111.6 kg)   SpO2 95%   BMI 36.33 kg/m                 Constitutional: Pt appears in NAD               HENT: Head: NCAT.                Right Ear: External ear normal.                 Left Ear: External ear normal.                Eyes: . Pupils are equal, round, and reactive to light. Conjunctivae and EOM are normal               Nose: without d/c or deformity               Neck: Neck supple. Gross normal ROM               Cardiovascular: Normal rate and regular rhythm.                 Pulmonary/Chest: Effort normal and breath sounds without rales or wheezing.                Abd:  Soft, NT, ND, + BS, no organomegaly               Neurological: Pt is alert. At baseline orientation, motor grossly intact               Skin: Skin is warm. 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               Psychiatric: Pt behavior is normal without agitation   Micro: none  Cardiac tracings I have personally interpreted today:  none  Pertinent Radiological findings (summarize): none   Lab Results  Component Value Date   WBC 5.3 05/20/2022   HGB 14.3 05/20/2022   HCT 43.0 05/20/2022   PLT 174.0 05/20/2022   GLUCOSE 81 05/20/2022   CHOL 117 05/20/2022   TRIG 151.0 (H) 05/20/2022   HDL 44.60 05/20/2022   LDLCALC 42 05/20/2022   ALT 27 05/20/2022   AST 20 05/20/2022   NA 139 05/20/2022   K 4.1 05/20/2022   CL 103 05/20/2022  CREATININE 1.27 05/20/2022   BUN 18 05/20/2022   CO2 29 05/20/2022   TSH 1.25 05/20/2022   PSA 0.37 05/20/2022   INR 0.9 09/22/2016   HGBA1C 6.1 05/20/2022    Assessment/Plan:  Carl Gardner is a 66 y.o. Black or African American [2] male with  has a past medical history of BPH (benign prostatic hypertrophy), Gout, Hepaitis C, History of cocaine abuse ([place]) (05/07/2021), HLD (hyperlipidemia) (05/07/2021), and Vitamin D deficiency (05/07/2021).  Vitamin D deficiency Last vitamin D Lab Results  Component Value Date   VD25OH 29.93 (L) 05/07/2021   Low, to start oral replacement   Encounter for routine adult health examination with abnormal findings Age and sex appropriate education and counseling updated with regular exercise and diet Referrals for preventative services - for colonoscopy Immunizations addressed - declines covid booster, pneumovax but will have prevnar 20 at his pharymacy Smoking counseling  - none needed Evidence for depression or other mood disorder - none significant Most recent labs reviewed. I have personally reviewed and have noted: 1) the patient's medical and social history 2) The patient's current medications and supplements 3) The patient's height, weight, and BMI have been recorded in the chart   HLD (hyperlipidemia) Lab Results  Component Value Date   LDLCALC 42 05/20/2022   Stable, pt to continue current statin crestor 20 mg   Pain and swelling of right knee Subjective per pt with bilateral OA changes, no giveaways, pt now for sport medicine referral  Bilateral wrist pain No swelling today, cant r/o underlyiing djd or intermittent gout, for films today and refer hand surgury for further evaluation  Chest pain Exam c/w probable msk, but for cxr Followup: No follow-ups on file.  Cathlean Cower, MD 05/23/2022 10:13 AM [facility]

## 2022-05-20 NOTE — Assessment & Plan Note (Signed)
Last vitamin D Lab Results  Component Value Date   VD25OH 29.93 (L) 05/07/2021   Low, to start oral replacement

## 2022-05-20 NOTE — Patient Instructions (Signed)
Please check to see if you need the Prevnar 20 pneumonia shot at the pharmacy  OK to use the OTC Voltaren gel as needed for pain to the knees and wrists  Please continue all other medications as before, and refills have been done if requested.  Please have the pharmacy call with any other refills you may need.  Please continue your efforts at being more active, low cholesterol diet, and weight control.  You are otherwise up to date with prevention measures today.  Please keep your appointments with your specialists as you may have planned  You will be contacted regarding the referral for: Sports medicine for the knee, and hand sugury for the wrists  Please go to the XRAY Department in the first floor for the x-ray testing - chest, right knee and wrists  Please go to the LAB at the blood drawing area for the tests to be done  You will be contacted by phone if any changes need to be made immediately.  Otherwise, you will receive a letter about your results with an explanation, but please check with MyChart first.  Please remember to sign up for MyChart if you have not done so, as this will be important to you in the future with finding out test results, communicating by private email, and scheduling acute appointments online when needed.  Please make an Appointment to return for your 1 year visit, or sooner if needed, with Lab testing by Appointment as well, to be done about 3-5 days before at the [facility] (so this is for TWO appointments - please see the scheduling desk as you leave)   Due to the ongoing Covid 19 pandemic, our lab now requires an appointment for any labs done at our office.  If you need labs done and do not have an appointment, please call our office ahead of time to schedule before presenting to the lab for your testing.

## 2022-05-21 ENCOUNTER — Encounter: Payer: Self-pay | Admitting: Internal Medicine

## 2022-05-21 NOTE — Progress Notes (Signed)
Printed & mailed letter to address on file.Marland KitchenJohny Gardner

## 2022-05-23 NOTE — Assessment & Plan Note (Signed)
Lab Results  Component Value Date   LDLCALC 42 05/20/2022   Stable, pt to continue current statin crestor 20 mg

## 2022-05-23 NOTE — Assessment & Plan Note (Signed)
No swelling today, cant r/o underlyiing djd or intermittent gout, for films today and refer hand surgury for further evaluation

## 2022-05-23 NOTE — Assessment & Plan Note (Signed)
Subjective per pt with bilateral OA changes, no giveaways, pt now for sport medicine referral

## 2022-05-23 NOTE — Assessment & Plan Note (Signed)
Age and sex appropriate education and counseling updated with regular exercise and diet Referrals for preventative services - for colonoscopy Immunizations addressed - declines covid booster, pneumovax but will have prevnar 20 at his pharymacy Smoking counseling  - none needed Evidence for depression or other mood disorder - none significant Most recent labs reviewed. I have personally reviewed and have noted: 1) the patient's medical and social history 2) The patient's current medications and supplements 3) The patient's height, weight, and BMI have been recorded in the chart

## 2022-05-23 NOTE — Assessment & Plan Note (Signed)
Exam c/w probable msk, but for cxr

## 2022-05-25 ENCOUNTER — Ambulatory Visit: Payer: Self-pay

## 2022-05-25 ENCOUNTER — Ambulatory Visit (INDEPENDENT_AMBULATORY_CARE_PROVIDER_SITE_OTHER): Payer: Medicare HMO | Admitting: Family Medicine

## 2022-05-25 VITALS — BP 142/84 | HR 63 | Ht 69.0 in | Wt 247.0 lb

## 2022-05-25 DIAGNOSIS — M79642 Pain in left hand: Secondary | ICD-10-CM | POA: Diagnosis not present

## 2022-05-25 DIAGNOSIS — M1A0610 Idiopathic chronic gout, right knee, without tophus (tophi): Secondary | ICD-10-CM | POA: Diagnosis not present

## 2022-05-25 DIAGNOSIS — M79641 Pain in right hand: Secondary | ICD-10-CM | POA: Diagnosis not present

## 2022-05-25 DIAGNOSIS — G5603 Carpal tunnel syndrome, bilateral upper limbs: Secondary | ICD-10-CM | POA: Diagnosis not present

## 2022-05-25 DIAGNOSIS — M25461 Effusion, right knee: Secondary | ICD-10-CM | POA: Diagnosis not present

## 2022-05-25 DIAGNOSIS — R748 Abnormal levels of other serum enzymes: Secondary | ICD-10-CM | POA: Diagnosis not present

## 2022-05-25 DIAGNOSIS — M25561 Pain in right knee: Secondary | ICD-10-CM | POA: Diagnosis not present

## 2022-05-25 LAB — SEDIMENTATION RATE: Sed Rate: 18 mm/hr (ref 0–20)

## 2022-05-25 LAB — CK: Total CK: 425 U/L — ABNORMAL HIGH (ref 7–232)

## 2022-05-25 LAB — URIC ACID: Uric Acid, Serum: 5.9 mg/dL (ref 4.0–7.8)

## 2022-05-25 NOTE — Patient Instructions (Addendum)
Thank you for coming in today.   You received an injection today. Seek immediate medical attention if the joint becomes red, extremely painful, or is oozing fluid.   Please get labs today before you leave   Please use Voltaren gel (Generic Diclofenac Gel) up to 4x daily for pain as needed.  This is available over-the-counter as both the name brand Voltaren gel and the generic diclofenac gel.   Try wearing carpal tunnel wrist braces  Check back in 6 weeks.

## 2022-05-25 NOTE — Progress Notes (Signed)
I, [name], LAT, ATC acting as a scribe for [name], MD.  Subjective:    CC: R knee pain  HPI: Pt is a 66 y/o male c/o R knee pain x 4 months. Pt recalls an incident when he was walking outside when he stepped into a hole and "twisted" his R knee. Pt locates pain to the anterior and posterior aspects. Pt has a hx of gout, but has not been talking his colchicine  R knee swelling: yes- w/ a "knot" in the popliteal fossa Mechanical symptoms: yes Aggravates: stepping wrong, stairs Treatments tried: copper knee compression, ointment, Tylenol, heat  Pt also c/o bilat hand pain and swelling. Pt c/o increased pain after working all day in [employer]. Pt locates pain through the palmar aspect and esp in the thenar eminence.   Dx imaging: 05/20/22 R knee XR  Pertinent review of Systems: No fevers or chills  Relevant historical information: Positive for chronic hepatitis C and gout.  No personal history of rheumatologic disease.   Objective:    Vitals:   05/25/22 1255  BP: (!) 142/84  Pulse: 63  SpO2: 94%   General: Well Developed, well nourished, and in no acute distress.   MSK: Hands bilaterally swelling across MCPs PIPs and DIPs. Normal wrist motion.  Hand motion decreased flexion MCPs and PIPs. Positive Tinel's bilateral carpal tunnel.  Positive Phalen's test bilateral carpal tunnel. Intact grip strength.  Right knee: Mild effusion.  Normal motion with crepitation.  Tender palpation medial and lateral joint line. Stable ligamentous exam.  Lab and Radiology Results  Procedure: Real-time Ultrasound Guided Injection of right knee superior lateral patellar space Device: Philips Affiniti 50G Images permanently stored and available for review in PACS Ultrasound evaluation reveals degenerative appearing medial meniscus with partial extrusion. Verbal informed consent obtained.  Discussed risks and benefits of procedure. Warned about infection, bleeding,  hyperglycemia damage to structures among others. Patient expresses understanding and agreement Time-out conducted.   Noted no overlying erythema, induration, or other signs of local infection.   Skin prepped in a sterile fashion.   Local anesthesia: Topical Ethyl chloride.   With sterile technique and under real time ultrasound guidance: 40 mg of Kenalog and 2 mL of Marcaine injected into knee joint. Fluid seen entering the joint capsule.   Completed without difficulty   Pain immediately resolved suggesting accurate placement of the medication.   Advised to call if fevers/chills, erythema, induration, drainage, or persistent bleeding.   Images permanently stored and available for review in the ultrasound unit.  Impression: Technically successful ultrasound guided injection.   Diagnostic Limited MSK Ultrasound of: Right carpal tunnel Median nerve enlarged measuring 19 mm cross-sectional area Impression: Probable moderate carpal tunnel syndrome   EXAM: RIGHT KNEE - COMPLETE 4+ VIEW   COMPARISON:  None Available.   FINDINGS: Lateral osteophytic changes are noted. Mild patellofemoral degenerative changes are noted as well. No acute fracture or dislocation is seen. No joint effusion is noted.   IMPRESSION: Mild degenerative change without acute abnormality.     Electronically Signed   By: Inez Catalina M.D.   On: 05/20/2022 23:11    EXAM: RIGHT WRIST - COMPLETE 3+ VIEW   COMPARISON:  None Available.   FINDINGS: Degenerative changes of the first [facility] joint are noted. No acute fracture or dislocation is seen. No soft tissue abnormality is noted.   IMPRESSION: Degenerative changes at the first [facility] joint without acute abnormality.     Electronically Signed  By: Inez Catalina M.D.   On: 05/20/2022 23:12 I, [name], personally (independently) visualized and performed the interpretation of the images attached in this note.  Impression and Recommendations:     Assessment and Plan: 66 y.o. male with  Right knee pain thought to be acute exacerbation of chronic knee pain and DJD.  Pain predominantly thought to be due to DJD.  There may be a gout component or even a rheumatologic disease component.  Plan for steroid injection and check back in 1 month.  Hand and wrist pain bilaterally.  This is thought to be more multifactorial.  It is likely that he has a component of carpal tunnel syndrome.  On ultrasound he meets criteria for moderate carpal tunnel syndrome.  Plan for carpal tunnel wrist brace and check back in a month.  If not better consider injection.  Additionally his hands are swollen indicating either osteoarthritis or more concerning for rheumatologic disorder.  He has known gout so we will go ahead and check uric acid but will also check other rheumatologic labs listed below..  Recheck in 1 month.  PDMP not reviewed this encounter. Orders Placed This Encounter  Procedures   [place] LIMITED JOINT SPACE STRUCTURES LOW RIGHT(NO LINKED CHARGES)    Order Specific Question:   Reason for Exam (SYMPTOM  OR DIAGNOSIS REQUIRED)    Answer:   right knee pain    Order Specific Question:   Preferred imaging location?    Answer:   [place]   [facility]    Standing Status:   Future    Standing Expiration Date:   05/26/2023   Sedimentation rate    Standing Status:   Future    Standing Expiration Date:   05/26/2023   Rheumatoid factor    Standing Status:   Future    Standing Expiration Date:   05/26/2023   Uric acid    Standing Status:   Future    Standing Expiration Date:   11/24/3783   Cyclic citrul peptide antibody, IgG    Standing Status:   Future    Standing Expiration Date:   05/26/2023   CK    Standing Status:   Future    Standing Expiration Date:   05/26/2023   HLA-B27 antigen    Standing Status:   Future    Standing Expiration Date:   05/26/2023   No orders of the defined types were placed in this encounter.   Discussed  warning signs or symptoms. Please see discharge instructions. Patient expresses understanding.  The above documentation has been reviewed and is accurate and complete [name], M.D.

## 2022-05-27 LAB — CYCLIC CITRUL PEPTIDE ANTIBODY, IGG: Cyclic Citrullin Peptide Ab: <16 UNITS

## 2022-05-27 LAB — ANA: Anti Nuclear Antibody (ANA): NEGATIVE

## 2022-05-27 LAB — RHEUMATOID FACTOR: Rheumatoid fact SerPl-aCnc: <14 IU/mL (ref <14)

## 2022-05-27 LAB — HLA-B27 ANTIGEN: HLA-B27 Antigen: NEGATIVE

## 2022-05-31 NOTE — Progress Notes (Signed)
One of your labs is a little bit positive.  However this is not the lab that we typically see for rheumatoid arthritis or lupus.  I plan to recheck it when you come back in September.

## 2022-06-01 ENCOUNTER — Ambulatory Visit (INDEPENDENT_AMBULATORY_CARE_PROVIDER_SITE_OTHER): Payer: Medicare HMO | Admitting: Orthopedic Surgery

## 2022-06-01 DIAGNOSIS — M19039 Primary osteoarthritis, unspecified wrist: Secondary | ICD-10-CM

## 2022-06-01 DIAGNOSIS — M189 Osteoarthritis of first carpometacarpal joint, unspecified: Secondary | ICD-10-CM | POA: Diagnosis not present

## 2022-06-01 DIAGNOSIS — R200 Anesthesia of skin: Secondary | ICD-10-CM

## 2022-06-06 ENCOUNTER — Encounter: Payer: Self-pay | Admitting: Orthopedic Surgery
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
## 2022-06-09 ENCOUNTER — Encounter: Payer: Self-pay | Admitting: Gastroenterology

## 2022-06-23 ENCOUNTER — Ambulatory Visit (AMBULATORY_SURGERY_CENTER): Payer: Self-pay | Admitting: *Deleted

## 2022-06-23 VITALS — Ht 69.0 in | Wt 248.2 lb

## 2022-06-23 DIAGNOSIS — Z1211 Encounter for screening for malignant neoplasm of colon: Secondary | ICD-10-CM

## 2022-06-23 NOTE — Progress Notes (Signed)
  No trouble with anesthesia, denies being told they were difficult to intubate, or hx/fam hx of malignant hyperthermia per pt   No egg or soy allergy  No home oxygen use   No medications for weight loss taken

## 2022-06-29 ENCOUNTER — Other Ambulatory Visit: Payer: Self-pay | Admitting: Internal Medicine

## 2022-06-29 DIAGNOSIS — R3911 Hesitancy of micturition: Secondary | ICD-10-CM

## 2022-06-29 NOTE — Telephone Encounter (Signed)
Please refill as per office routine med refill policy (all routine meds to be refilled for 3 mo or monthly (per pt preference) up to one year from last visit, then month to month grace period for 3 mo, then further med refills will have to be denied) ? ?

## 2022-07-06 ENCOUNTER — Ambulatory Visit: Payer: Self-pay

## 2022-07-06 ENCOUNTER — Ambulatory Visit (INDEPENDENT_AMBULATORY_CARE_PROVIDER_SITE_OTHER): Payer: Medicare HMO | Admitting: Family Medicine

## 2022-07-06 VITALS — BP 130/82 | HR 65 | Ht 69.0 in | Wt 247.0 lb

## 2022-07-06 DIAGNOSIS — M25532 Pain in left wrist: Secondary | ICD-10-CM | POA: Diagnosis not present

## 2022-07-06 DIAGNOSIS — M25531 Pain in right wrist: Secondary | ICD-10-CM

## 2022-07-06 DIAGNOSIS — G5603 Carpal tunnel syndrome, bilateral upper limbs: Secondary | ICD-10-CM

## 2022-07-06 NOTE — Progress Notes (Signed)
I, Carl Gardner, LAT, ATC acting as a scribe for Carl Leader, MD.  Carl Gardner is a 66 y.o. male who presents to [facility] [facility] today for f/u bilat CTS and R knee pain. Pt works as a Psychiatrist. Pt was last seen by Dr. Georgina Snell on 05/25/22 and was given a R knee steroid injection and labs were obtained. Pt was also advised to wear carpal tunnel wrist braces. Today, pt reports R knee is feeling good. Pt c/o bilat wrist pain w/ intermittent numbness/tingling into 2nd-3rd fingers. Pt notes that both hands appear swollen to him.  The main problem is wrist pain and swelling.  He has less numb tingly than wrist pain.  Dx imaging: 05/25/22 Labs 05/20/22 R knee, R & L wrist XR  Pertinent review of systems: No fevers or chills  Relevant historical information: Hyperlipidemia   Exam:  BP 130/82   Pulse 65   Ht '5\' 9"'$  (1.753 m)   Wt 247 lb (112 kg)   SpO2 95%   BMI 36.48 kg/m  General: Well Developed, well nourished, and in no acute distress.   MSK: Bilateral wrist mild effusion.  Decreased motion.  Tender palpation dorsal wrist.  Mild positive Tinel's carpal tunnel.    Lab and Radiology Results   Procedure: Real-time Ultrasound Guided Injection of right wrist dorsal radiocarpal joint Device: Philips Affiniti 50G Images permanently stored and available for review in PACS Verbal informed consent obtained.  Discussed risks and benefits of procedure. Warned about infection, bleeding, hyperglycemia damage to structures among others. Patient expresses understanding and agreement Time-out conducted.   Noted no overlying erythema, induration, or other signs of local infection.   Skin prepped in a sterile fashion.   Local anesthesia: Topical Ethyl chloride.   With sterile technique and under real time ultrasound guidance: 40 mg of Kenalog and 1 mL of lidocaine injected into dorsal wrist joint. Fluid seen entering the wrist joint.   Completed without difficulty    Pain immediately resolved suggesting accurate placement of the medication.   Advised to call if fevers/chills, erythema, induration, drainage, or persistent bleeding.   Images permanently stored and available for review in the ultrasound unit.  Impression: Technically successful ultrasound guided injection.    Procedure: Real-time Ultrasound Guided Injection of left dorsal wrist radiocarpal joint Device: Philips Affiniti 50G Images permanently stored and available for review in PACS Verbal informed consent obtained.  Discussed risks and benefits of procedure. Warned about infection, bleeding, hyperglycemia damage to structures among others. Patient expresses understanding and agreement Time-out conducted.   Noted no overlying erythema, induration, or other signs of local infection.   Skin prepped in a sterile fashion.   Local anesthesia: Topical Ethyl chloride.   With sterile technique and under real time ultrasound guidance: 40 mg of Kenalog and 1 mL of lidocaine injected into dorsal wrist joint. Fluid seen entering the wrist joint.   Completed without difficulty   Pain immediately resolved suggesting accurate placement of the medication.   Advised to call if fevers/chills, erythema, induration, drainage, or persistent bleeding.   Images permanently stored and available for review in the ultrasound unit.  Impression: Technically successful ultrasound guided injection.    EXAM: LEFT WRIST - COMPLETE 3+ VIEW   COMPARISON:  04/19/2006   FINDINGS: Irregularity in the distal radius is noted consistent with prior healed fracture. Mild first [place] joint degenerative changes are noted but lesser than that seen on the right.   IMPRESSION: Posttraumatic and  degenerative changes.  No acute abnormality noted.     Electronically Signed   By: Inez Catalina M.D.   On: 05/20/2022 23:13     EXAM: RIGHT WRIST - COMPLETE 3+ VIEW   COMPARISON:  None Available.   FINDINGS: Degenerative  changes of the first [facility] joint are noted. No acute fracture or dislocation is seen. No soft tissue abnormality is noted.   IMPRESSION: Degenerative changes at the first [facility] joint without acute abnormality.     Electronically Signed   By: Inez Catalina M.D.   On: 05/20/2022 23:12   I, Carl Gardner, personally (independently) visualized and performed the interpretation of the images attached in this note.   Assessment and Plan: 66 y.o. male with bilateral wrist pain thought to be for due primarily to DJD.  Plan for steroid injection bilateral wrist joints.  He also does have what seems to be carpal tunnel syndrome which we could address as well with injection in the future.  Recheck back if not improving.  Consider carpal tunnel injection if needed.   PDMP not reviewed this encounter. Orders Placed This Encounter  Procedures   [place] LIMITED JOINT SPACE STRUCTURES UP BILAT(NO LINKED CHARGES)    Order Specific Question:   Reason for Exam (SYMPTOM  OR DIAGNOSIS REQUIRED)    Answer:   bilateral wrist pain    Order Specific Question:   Preferred imaging location?    Answer:   [place]   No orders of the defined types were placed in this encounter.    Discussed warning signs or symptoms. Please see discharge instructions. Patient expresses understanding.   The above documentation has been reviewed and is accurate and complete Carl Gardner, M.D.

## 2022-07-06 NOTE — Patient Instructions (Addendum)
Thank you for coming in today.   You received an injection today. Seek immediate medical attention if the joint becomes red, extremely painful, or is oozing fluid.   Let me know if not improving we can try carpal tunnel injections.

## 2022-07-14 ENCOUNTER — Encounter: Payer: Self-pay | Admitting: Gastroenterology

## 2022-07-14 ENCOUNTER — Ambulatory Visit (AMBULATORY_SURGERY_CENTER): Payer: Medicare HMO | Admitting: Gastroenterology

## 2022-07-14 VITALS — BP 150/90 | HR 59 | Temp 97.7°F | Resp 16 | Ht 69.0 in | Wt 248.0 lb

## 2022-07-14 DIAGNOSIS — Z1211 Encounter for screening for malignant neoplasm of colon: Secondary | ICD-10-CM | POA: Diagnosis not present

## 2022-07-14 DIAGNOSIS — D123 Benign neoplasm of transverse colon: Secondary | ICD-10-CM

## 2022-07-14 DIAGNOSIS — F1290 Cannabis use, unspecified, uncomplicated: Secondary | ICD-10-CM | POA: Diagnosis not present

## 2022-07-14 MED ORDER — SODIUM CHLORIDE 0.9 % IV SOLN: 500.0000 mL | Freq: Once | INTRAVENOUS | Status: DC

## 2022-07-14 NOTE — Progress Notes (Signed)
Pt's states no medical or surgical changes since previsit or office visit. 

## 2022-07-14 NOTE — Progress Notes (Signed)
Report to pacu rn. Vss. Care resumed by rn. 

## 2022-07-14 NOTE — Op Note (Signed)
[facility] Patient Name: Carl Gardner Procedure Date: 07/14/2022 9:18 AM MRN: [id number] Endoscopist: Nicki Reaper E. Candis Schatz , MD Age: 66 Referring MD:  Date of Birth: 1956/08/12 Gender: Male Account #: [id number] Procedure:                Colonoscopy Indications:              Screening for colorectal malignant neoplasm, This                            is the patient's first colonoscopy Medicines:                Monitored Anesthesia Care Procedure:                Pre-Anesthesia Assessment:                           - Prior to the procedure, a History and Physical                            was performed, and patient medications and                            allergies were reviewed. The patient's tolerance of                            previous anesthesia was also reviewed. The risks                            and benefits of the procedure and the sedation                            options and risks were discussed with the patient.                            All questions were answered, and informed consent                            was obtained. Prior Anticoagulants: The patient has                            taken no previous anticoagulant or antiplatelet                            agents. ASA Grade Assessment: II - A patient with                            mild systemic disease. After reviewing the risks                            and benefits, the patient was deemed in                            satisfactory condition to undergo the procedure.  After obtaining informed consent, the colonoscope                            was passed under direct vision. Throughout the                            procedure, the patient's blood pressure, pulse, and                            oxygen saturations were monitored continuously. The                            Olympus CF-HQ190L (Serial# [id number]) Colonoscope was                            introduced through  the anus and advanced to the the                            terminal ileum, with identification of the                            appendiceal orifice and IC valve. The colonoscopy                            was performed without difficulty. The patient                            tolerated the procedure well. The quality of the                            bowel preparation was adequate. The terminal ileum,                            ileocecal valve, appendiceal orifice, and rectum                            were photographed. The bowel preparation used was                            Miralax via split dose instruction. Scope In: 9:38:06 AM Scope Out: 10:00:26 AM Scope Withdrawal Time: 0 hours 18 minutes 15 seconds  Total Procedure Duration: 0 hours 22 minutes 20 seconds  Findings:                 Hemorrhoids were found on perianal exam.                           The digital rectal exam was normal. Pertinent                            negatives include normal sphincter tone and no                            palpable rectal lesions.  A 4 mm polyp was found in the splenic flexure. The                            polyp was sessile. The polyp was removed with a                            cold snare. Resection and retrieval were complete.                            Estimated blood loss was minimal.                           A 2 mm polyp was found in the splenic flexure. The                            polyp was sessile. The polyp was removed with a                            cold biopsy forceps. Resection and retrieval were                            complete. Estimated blood loss was minimal.                           The exam was otherwise normal throughout the                            examined colon.                           The terminal ileum appeared normal.                           Non-bleeding internal hemorrhoids were found during                             retroflexion. The hemorrhoids were Grade III                            (internal hemorrhoids that prolapse but require                            manual reduction).                           No additional abnormalities were found on                            retroflexion. Complications:            No immediate complications. Estimated Blood Loss:     Estimated blood loss was minimal. Impression:               - Hemorrhoids found on perianal exam.                           -  One 4 mm polyp at the splenic flexure, removed                            with a cold snare. Resected and retrieved.                           - One 2 mm polyp at the splenic flexure, removed                            with a cold biopsy forceps. Resected and retrieved.                           - The examined portion of the ileum was normal.                           - Non-bleeding internal hemorrhoids. Recommendation:           - Patient has a contact number available for                            emergencies. The signs and symptoms of potential                            delayed complications were discussed with the                            patient. Return to normal activities tomorrow.                            Written discharge instructions were provided to the                            patient.                           - Resume previous diet.                           - Continue present medications.                           - Await pathology results.                           - Repeat colonoscopy (date not yet determined) for                            surveillance based on pathology results.                           - Recommend Metamucil daily to reduce hemorrhoidal                            bleeding                           -  Consider hemorrhoid banding for more definitive                            treatment of hemorrhoids. Marquez Ceesay E. Candis Schatz, MD 07/14/2022 10:06:05 AM This report has been  signed electronically.

## 2022-07-14 NOTE — Patient Instructions (Signed)
Handout on polyps given to you today Return to normal activities tomorrow  Continue current medications Resume previous diet  Await pathology results  Recommend metamucil daily Consider hemorrhoid banding   YOU HAD AN ENDOSCOPIC PROCEDURE TODAY AT [place]:   Refer to the procedure report that was given to you for any specific questions about what was found during the examination.  If the procedure report does not answer your questions, please call your gastroenterologist to clarify.  If you requested that your care partner not be given the details of your procedure findings, then the procedure report has been included in a sealed envelope for you to review at your convenience later.  YOU SHOULD EXPECT: Some feelings of bloating in the abdomen. Passage of more gas than usual.  Walking can help get rid of the air that was put into your GI tract during the procedure and reduce the bloating. If you had a lower endoscopy (such as a colonoscopy or flexible sigmoidoscopy) you may notice spotting of blood in your stool or on the toilet paper. If you underwent a bowel prep for your procedure, you may not have a normal bowel movement for a few days.  Please Note:  You might notice some irritation and congestion in your nose or some drainage.  This is from the oxygen used during your procedure.  There is no need for concern and it should clear up in a day or so.  SYMPTOMS TO REPORT IMMEDIATELY:  Following lower endoscopy (colonoscopy or flexible sigmoidoscopy):  Excessive amounts of blood in the stool  Significant tenderness or worsening of abdominal pains  Swelling of the abdomen that is new, acute  Fever of 100F or higher  For urgent or emergent issues, a gastroenterologist can be reached at any hour by calling [phone]. Do not use MyChart messaging for urgent concerns.    DIET:  We do recommend a small meal at first, but then you may proceed to your regular diet.  Drink  plenty of fluids but you should avoid alcoholic beverages for 24 hours.  ACTIVITY:  You should plan to take it easy for the rest of today and you should NOT DRIVE or use heavy machinery until tomorrow (because of the sedation medicines used during the test).    FOLLOW UP: Our staff will call the number listed on your records the next business day following your procedure.  We will call around 7:15- 8:00 am to check on you and address any questions or concerns that you may have regarding the information given to you following your procedure. If we do not reach you, we will leave a message.     If any biopsies were taken you will be contacted by phone or by letter within the next 1-3 weeks.  Please call us at [phone] if you have not heard about the biopsies in 3 weeks.   Repeat colonoscopy for surveillance based on pathology results   SIGNATURES/CONFIDENTIALITY: You and/or your care partner have signed paperwork which will be entered into your electronic medical record.  These signatures attest to the fact that that the information above on your After Visit Summary has been reviewed and is understood.  Full responsibility of the confidentiality of this discharge information lies with you and/or your care-partner.

## 2022-07-14 NOTE — Progress Notes (Signed)
Called to room to assist during endoscopic procedure.  Patient ID and intended procedure confirmed with present staff. Received instructions for my participation in the procedure from the performing physician.  

## 2022-07-14 NOTE — Progress Notes (Signed)
[facility] History and Physical   Primary Care Physician:  Biagio Borg, MD   Reason for Procedure:   Colon cancer screening  Plan:    Screening colonoscopy     HPI: Carl Gardner is a 66 y.o. male undergoing initial average risk screening colonoscopy.  He has no family history of colon cancer and no chronic GI symptoms other than intermittent bright red blood attributed to hemorrhoids..    Past Medical History:  Diagnosis Date   Arthritis    hands, knees   BPH (benign prostatic hypertrophy)    Gout    Hepaitis C    Hep C   History of cocaine abuse ([place]) 05/07/2021   HLD (hyperlipidemia) 05/07/2021   Substance abuse ([place])    marijuana   Vitamin D deficiency 05/07/2021    Past Surgical History:  Procedure Laterality Date   SHOULDER ARTHROSCOPY WITH DISTAL CLAVICLE RESECTION Left 06/30/2015   Procedure: SHOULDER ARTHROSCOPY WITH DISTAL CLAVICLE RESECTION; LABRAL TEAR DEBRIDEMENT;  Surgeon: Tania Ade, MD;  Location: [facility];  Service: Orthopedics;  Laterality: Left;   SHOULDER ARTHROSCOPY WITH ROTATOR CUFF REPAIR AND SUBACROMIAL DECOMPRESSION Left 06/30/2015   Procedure: SHOULDER ARTHROSCOPY WITH ARTHROSCOPIC ROTATOR CUFF REPAIR, SUBACROMIAL DECOMPRESSION;  Surgeon: Tania Ade, MD;  Location: [facility];  Service: Orthopedics;  Laterality: Left;   WRIST SURGERY Left     Prior to Admission medications   Medication Sig Start Date End Date Taking? Authorizing Provider  Apoaequorin (PREVAGEN PO) Take by mouth daily.   Yes [provider]  Ascorbic Acid (VITAMIN C PO) Take by mouth daily.   Yes [provider]  colchicine 0.6 MG tablet Day one -Take two tablets at once, wait one hour and take another tablet. Day 2 and thereafter Take one tablet daily. 08/03/18  Yes Clent Demark, PA-C  finasteride (PROSCAR) 5 MG tablet Take 1 tablet (5 mg total) by mouth daily. 04/03/18  Yes Clent Demark,  PA-C  indomethacin (INDOCIN) 50 MG capsule Take 1 capsule (50 mg total) by mouth 3 (three) times daily as needed (gout). 05/07/21  Yes Biagio Borg, MD  Multiple Vitamin (MULTIVITAMIN ADULT PO) Take by mouth daily.   Yes [provider]  rosuvastatin (CRESTOR) 20 MG tablet Take 1 tablet (20 mg total) by mouth daily. 05/26/21  Yes Biagio Borg, MD  tamsulosin [facility]) 0.4 MG CAPS capsule Take 1 capsule by mouth once daily 06/30/22  Yes Biagio Borg, MD  VITAMIN D PO Take by mouth daily.   Yes [provider]    Current Outpatient Medications  Medication Sig Dispense Refill   Apoaequorin (PREVAGEN PO) Take by mouth daily.     Ascorbic Acid (VITAMIN C PO) Take by mouth daily.     colchicine 0.6 MG tablet Day one -Take two tablets at once, wait one hour and take another tablet. Day 2 and thereafter Take one tablet daily. 30 tablet 0   finasteride (PROSCAR) 5 MG tablet Take 1 tablet (5 mg total) by mouth daily. 30 tablet 5   indomethacin (INDOCIN) 50 MG capsule Take 1 capsule (50 mg total) by mouth 3 (three) times daily as needed (gout). 90 capsule 3   Multiple Vitamin (MULTIVITAMIN ADULT PO) Take by mouth daily.     rosuvastatin (CRESTOR) 20 MG tablet Take 1 tablet (20 mg total) by mouth daily. 90 tablet 3   tamsulosin (FLOMAX) 0.4 MG CAPS capsule Take 1 capsule by mouth once daily 90  capsule 0   VITAMIN D PO Take by mouth daily.     Current Facility-Administered Medications  Medication Dose Route Frequency Provider Last Rate Last Admin   0.9 %  sodium chloride infusion  500 mL Intravenous Once Daryel November, MD        Allergies as of 07/14/2022 - Review Complete 07/14/2022  Allergen Reaction Noted   Penicillins Hives 06/25/2015    Family History  Adopted: Yes    Social History   Socioeconomic History   Marital status: Legally Separated    Spouse name: Not on file   Number of children: 2   Years of education: 12   Highest education level: Not on file   Occupational History   Occupation: Caregiver  Tobacco Use   Smoking status: Never   Smokeless tobacco: Never  Vaping Use   Vaping Use: Never used  Substance and Sexual Activity   Alcohol use: Yes    Comment: occasional   Drug use: Yes    Frequency: 3.0 times per week    Types: Marijuana    Comment: past used cocaine- 25 years ago   Sexual activity: Not Currently  Other Topics Concern   Not on file  Social History Narrative   Fun: Take care of mom   Social Determinants of Health   Financial Resource Strain: Not on file  Food Insecurity: Not on file  Transportation Needs: Not on file  Physical Activity: Not on file  Stress: Not on file  Social Connections: Not on file  Intimate Partner Violence: Not on file    Review of Systems:  All other review of systems negative except as mentioned in the HPI.  Physical Exam: Vital signs BP (!) 160/84   Pulse (!) 58   Temp 97.7 F (36.5 C) (Temporal)   Resp 10   Ht '5\' 9"'$  (1.753 m)   Wt 248 lb (112.5 kg)   SpO2 99%   BMI 36.62 kg/m   General:   Alert,  Well-developed, well-nourished, pleasant and cooperative in NAD Airway:  Mallampati 1 Lungs:  Clear throughout to auscultation.   Heart:  Regular rate and rhythm; no murmurs, clicks, rubs,  or gallops. Abdomen:  Soft, nontender and nondistended. Normal bowel sounds.   Neuro/Psych:  Normal mood and affect. A and O x 3   Arlanda Shiplett E. Candis Schatz, MD [facility]

## 2022-07-15 ENCOUNTER — Telehealth: Payer: Self-pay | Admitting: *Deleted

## 2022-07-15 ENCOUNTER — Telehealth: Payer: Self-pay

## 2022-07-15 NOTE — Telephone Encounter (Signed)
-----   Message from Daryel November, MD sent at 07/14/2022  2:41 PM EDT ----- Regarding: Hemorrhoid banding appointment Vaughan Basta,  Can you please get Mr. Carl Gardner scheduled for a hemorrhoid banding appointment with me? Thanks

## 2022-07-15 NOTE — Telephone Encounter (Signed)
  Follow up Call-     07/14/2022    8:22 AM  Call back number  Post procedure Call Back phone  # [phone]  Permission to leave phone message Yes     Patient questions:  Do you have a fever, pain , or abdominal swelling? No. Pain Score  0 *  Have you tolerated food without any problems? Yes.    Have you been able to return to your normal activities? Yes.    Do you have any questions about your discharge instructions: Diet   No. Medications  No. Follow up visit  No.  Do you have questions or concerns about your Care? No.  Actions: * If pain score is 4 or above: No action needed, pain <4.

## 2022-07-15 NOTE — Telephone Encounter (Signed)
Pt scheduled for hem banding 08/16/22'@10'$ :30am. Appt letter mailed to pt.

## 2022-07-19 ENCOUNTER — Encounter: Payer: Self-pay | Admitting: Gastroenterology

## 2022-07-20 DIAGNOSIS — H524 Presbyopia: Secondary | ICD-10-CM | POA: Diagnosis not present

## 2022-07-20 DIAGNOSIS — H2513 Age-related nuclear cataract, bilateral: Secondary | ICD-10-CM | POA: Diagnosis not present

## 2022-07-20 DIAGNOSIS — H35361 Drusen (degenerative) of macula, right eye: Secondary | ICD-10-CM | POA: Diagnosis not present

## 2022-07-20 DIAGNOSIS — H5213 Myopia, bilateral: Secondary | ICD-10-CM | POA: Diagnosis not present

## 2022-07-28 ENCOUNTER — Other Ambulatory Visit: Payer: Self-pay | Admitting: Internal Medicine

## 2022-07-28 NOTE — Telephone Encounter (Signed)
Please refill as per office routine med refill policy (all routine meds to be refilled for 3 mo or monthly (per pt preference) up to one year from last visit, then month to month grace period for 3 mo, then further med refills will have to be denied) ? ?

## 2022-08-16 ENCOUNTER — Encounter: Payer: Self-pay | Admitting: Gastroenterology

## 2022-08-16 ENCOUNTER — Ambulatory Visit (INDEPENDENT_AMBULATORY_CARE_PROVIDER_SITE_OTHER): Payer: Medicare HMO | Admitting: Gastroenterology

## 2022-08-16 VITALS — BP 142/74 | HR 75 | Ht 68.5 in | Wt 248.0 lb

## 2022-08-16 DIAGNOSIS — K642 Third degree hemorrhoids: Secondary | ICD-10-CM

## 2022-08-16 NOTE — Progress Notes (Signed)
PROCEDURE NOTE: Patient reports long standing history of recurrent frequent hematochezia and infrequent prolapse symptoms requiring manual reduction.  Also reports seepage of blood tinged fluid on occasion.  He underwent a screening colonoscopy last month and prolapsing internal hemorrhoids were seen.  The patient presents with symptomatic grade 3  hemorrhoids, requesting rubber band ligation of his/her hemorrhoidal disease.  All risks, benefits and alternative forms of therapy were described and informed consent was obtained.  The anorectum was pre-medicated with topical lidocaine (5%) and nitroglycerin (0.125%) The decision was made to band the Left lateral internal hemorrhoid, and the Engelhard was used to perform band ligation without complication.  Digital anorectal examination was then performed to assure proper positioning of the band, and to adjust the banded tissue as required.  The patient was discharged home without pain or other issues.  Dietary and behavioral recommendations were given and along with follow-up instructions.     The following adjunctive treatments were recommended:  Fiber supplementation Adequate water intake Avoidance of straining and hard stools  The patient will return in 2-4 weeks for  follow-up and possible additional banding as required. No complications were encountered and the patient tolerated the procedure well.

## 2022-08-16 NOTE — Patient Instructions (Signed)
_______________________________________________________  If you are age 65 or older, your body mass index should be between 23-30. Your Body mass index is 37.16 kg/m. If this is out of the aforementioned range listed, please consider follow up with your Primary Care Provider.  If you are age 2 or younger, your body mass index should be between 19-25. Your Body mass index is 37.16 kg/m. If this is out of the aformentioned range listed, please consider follow up with your Primary Care Provider.   HEMORRHOID BANDING PROCEDURE    FOLLOW-UP CARE   The procedure you have had should have been relatively painless since the banding of the area involved does not have nerve endings and there is no pain sensation.  The rubber band cuts off the blood supply to the hemorrhoid and the band may fall off as soon as 48 hours after the banding (the band may occasionally be seen in the toilet bowl following a bowel movement). You may notice a temporary feeling of fullness in the rectum which should respond adequately to plain Tylenol or Motrin.  Following the banding, avoid strenuous exercise that evening and resume full activity the next day.  A sitz bath (soaking in a warm tub) or bidet is soothing, and can be useful for cleansing the area after bowel movements.     To avoid constipation, take two tablespoons of natural wheat bran, natural oat bran, flax, Benefiber or any over the counter fiber supplement and increase your water intake to 7-8 glasses daily.    Unless you have been prescribed anorectal medication, do not put anything inside your rectum for two weeks: No suppositories, enemas, fingers, etc.  Occasionally, you may have more bleeding than usual after the banding procedure.  This is often from the untreated hemorrhoids rather than the treated one.  Don't be concerned if there is a tablespoon or so of blood.  If there is more blood than this, lie flat with your bottom higher than your head and  apply an ice pack to the area. If the bleeding does not stop within a half an hour or if you feel faint, call our office at [phone] or go to the emergency room.  Problems are not common; however, if there is a substantial amount of bleeding, severe pain, chills, fever or difficulty passing urine (very rare) or other problems, you should call us at [phone] or report to the nearest emergency room.  Do not stay seated continuously for more than 2-3 hours for a day or two after the procedure.  Tighten your buttock muscles 10-15 times every two hours and take 10-15 deep breaths every 1-2 hours.  Do not spend more than a few minutes on the toilet if you cannot empty your bowel; instead re-visit the toilet at a later time.    The [facility] providers would like to encourage you to use [facility] to communicate with providers for non-urgent requests or questions.  Due to long hold times on the telephone, sending your provider a message by [facility] may be a faster and more efficient way to get a response.  Please allow 48 business hours for a response.  Please remember that this is for non-urgent requests.   It was a pleasure to see you today!  Thank you for trusting me with your gastrointestinal care!    Scott E.Candis Schatz, MD

## 2022-09-28 ENCOUNTER — Ambulatory Visit (INDEPENDENT_AMBULATORY_CARE_PROVIDER_SITE_OTHER): Payer: Medicare HMO | Admitting: Gastroenterology

## 2022-09-28 ENCOUNTER — Encounter: Payer: Self-pay | Admitting: Gastroenterology

## 2022-09-28 VITALS — BP 130/88 | HR 64 | Ht 68.0 in | Wt 252.0 lb

## 2022-09-28 DIAGNOSIS — K642 Third degree hemorrhoids: Secondary | ICD-10-CM

## 2022-09-28 NOTE — Patient Instructions (Signed)
_______________________________________________________  If you are age 65 or older, your body mass index should be between 23-30. Your Body mass index is 38.32 kg/m. If this is out of the aforementioned range listed, please consider follow up with your Primary Care Provider.  If you are age 49 or younger, your body mass index should be between 19-25. Your Body mass index is 38.32 kg/m. If this is out of the aformentioned range listed, please consider follow up with your Primary Care Provider.   Please give [place] a call if symptoms return.   The [facility] providers would like to encourage you to use [facility] to communicate with providers for non-urgent requests or questions.  Due to long hold times on the telephone, sending your provider a message by [facility] may be a faster and more efficient way to get a response.  Please allow 48 business hours for a response.  Please remember that this is for non-urgent requests.   It was a pleasure to see you today!  Thank you for trusting me with your gastrointestinal care!

## 2022-09-28 NOTE — Progress Notes (Signed)
    History of Present Illness: Carl Gardner presents to our office today, scheduled for repeat banding of his internal hemorrhoids.  He underwent his initial banding on October 30, with ligation of the left lateral column.  The patient states that all of his hemorrhoid symptoms have essentially resolved following this 1 band.  He no longer has any prolapsing.  He has had blood on the toilet paper once or twice, but it has been very scant and nothing like what he was experiencing before his banding.  He has been taking Metamucil daily and has had significant improvement in his bowel habits.  He no longer has troubles with hard stools or straining.  He asks if it is possible to hold off on further banding given the significant improvement in his symptoms.   Current Medications, Allergies, Past Medical History, Past Surgical History, Family History and Social History were reviewed in [employer] record.  Studies:   No results found.   Physical Exam: General: Pleasant, well developed ,  African-American male in no acute distress Head: Normocephalic and atraumatic Eyes:  sclerae anicteric, conjunctiva pink  Abdomen: Soft, non distended, non-tender. No masses, no hepatomegaly. Normal bowel sounds Neurological: Alert oriented x 4, grossly nonfocal Psychological:  Alert and cooperative. Normal mood and affect  Assessment and Recommendations: 66 year old male with longstanding grade 3 hemorrhoids status post hemorrhoid banding of left lateral column on October 30 and daily fiber supplementation with marked improvement in his hemorrhoid symptoms.  His only lingering symptom is occasional scant painless hematochezia.  Given the significant improvement in symptoms, I think it is reasonable to hold off on further banding at this time.  The patient will contact us if his symptoms worsen and we will schedule him for further banding.  Grade 3 internal hemorrhoids, status post band  ligation of left lateral column - Hold off on further banding for now; patient to call us if symptoms return - Continue daily Metamucil indefinitely  Niara Bunker E. Candis Schatz, MD [facility]

## 2022-10-13 ENCOUNTER — Other Ambulatory Visit: Payer: Self-pay | Admitting: Internal Medicine

## 2022-10-13 DIAGNOSIS — R3911 Hesitancy of micturition: Secondary | ICD-10-CM

## 2022-10-13 NOTE — Telephone Encounter (Signed)
Please refill as per office routine med refill policy (all routine meds to be refilled for 3 mo or monthly (per pt preference) up to one year from last visit, then month to month grace period for 3 mo, then further med refills will have to be denied) ? ?

## 2022-11-15 ENCOUNTER — Other Ambulatory Visit: Payer: Self-pay | Admitting: Internal Medicine

## 2022-11-15 NOTE — Telephone Encounter (Signed)
Please refill as per office routine med refill policy (all routine meds to be refilled for 3 mo or monthly (per pt preference) up to one year from last visit, then month to month grace period for 3 mo, then further med refills will have to be denied)

## 2022-11-16 ENCOUNTER — Ambulatory Visit (INDEPENDENT_AMBULATORY_CARE_PROVIDER_SITE_OTHER): Payer: 59 | Admitting: Internal Medicine

## 2022-11-16 ENCOUNTER — Encounter: Payer: Self-pay | Admitting: Internal Medicine

## 2022-11-16 VITALS — BP 134/78 | HR 70 | Temp 98.1°F | Ht 68.0 in | Wt 257.0 lb

## 2022-11-16 DIAGNOSIS — E7800 Pure hypercholesterolemia, unspecified: Secondary | ICD-10-CM | POA: Diagnosis not present

## 2022-11-16 DIAGNOSIS — I10 Essential (primary) hypertension: Secondary | ICD-10-CM

## 2022-11-16 DIAGNOSIS — R739 Hyperglycemia, unspecified: Secondary | ICD-10-CM

## 2022-11-16 DIAGNOSIS — Z125 Encounter for screening for malignant neoplasm of prostate: Secondary | ICD-10-CM

## 2022-11-16 DIAGNOSIS — E559 Vitamin D deficiency, unspecified: Secondary | ICD-10-CM | POA: Diagnosis not present

## 2022-11-16 DIAGNOSIS — Z23 Encounter for immunization: Secondary | ICD-10-CM | POA: Diagnosis not present

## 2022-11-16 DIAGNOSIS — M5432 Sciatica, left side: Secondary | ICD-10-CM

## 2022-11-16 DIAGNOSIS — E538 Deficiency of other specified B group vitamins: Secondary | ICD-10-CM

## 2022-11-16 DIAGNOSIS — Z0001 Encounter for general adult medical examination with abnormal findings: Secondary | ICD-10-CM

## 2022-11-16 LAB — HEMOGLOBIN A1C: Hgb A1c MFr Bld: 6 % (ref 4.6–6.5)

## 2022-11-16 LAB — HEPATIC FUNCTION PANEL
ALT: 30 U/L (ref 0–53)
AST: 23 U/L (ref 0–37)
Albumin: 4.5 g/dL (ref 3.5–5.2)
Alkaline Phosphatase: 81 U/L (ref 39–117)
Bilirubin, Direct: 0.1 mg/dL (ref 0.0–0.3)
Total Bilirubin: 0.5 mg/dL (ref 0.2–1.2)
Total Protein: 7 g/dL (ref 6.0–8.3)

## 2022-11-16 LAB — CBC WITH DIFFERENTIAL/PLATELET
Basophils Absolute: 0 10*3/uL (ref 0.0–0.1)
Basophils Relative: 0.2 % (ref 0.0–3.0)
Eosinophils Absolute: 0 10*3/uL (ref 0.0–0.7)
Eosinophils Relative: 0.8 % (ref 0.0–5.0)
HCT: 43.1 % (ref 39.0–52.0)
Hemoglobin: 14.7 g/dL (ref 13.0–17.0)
Lymphocytes Relative: 42.2 % (ref 12.0–46.0)
Lymphs Abs: 2.2 10*3/uL (ref 0.7–4.0)
MCHC: 34.2 g/dL (ref 30.0–36.0)
MCV: 101.2 fl — ABNORMAL HIGH (ref 78.0–100.0)
Monocytes Absolute: 0.5 10*3/uL (ref 0.1–1.0)
Monocytes Relative: 9.5 % (ref 3.0–12.0)
Neutro Abs: 2.5 10*3/uL (ref 1.4–7.7)
Neutrophils Relative %: 47.3 % (ref 43.0–77.0)
Platelets: 173 10*3/uL (ref 150.0–400.0)
RBC: 4.25 Mil/uL (ref 4.22–5.81)
RDW: 12.7 % (ref 11.5–15.5)
WBC: 5.2 10*3/uL (ref 4.0–10.5)

## 2022-11-16 LAB — BASIC METABOLIC PANEL
BUN: 13 mg/dL (ref 6–23)
CO2: 30 mEq/L (ref 19–32)
Calcium: 8.9 mg/dL (ref 8.4–10.5)
Chloride: 104 mEq/L (ref 96–112)
Creatinine, Ser: 1.24 mg/dL (ref 0.40–1.50)
GFR: 60.91 mL/min (ref >60.00)
Glucose, Bld: 72 mg/dL (ref 70–99)
Potassium: 4 mEq/L (ref 3.5–5.1)
Sodium: 142 mEq/L (ref 135–145)

## 2022-11-16 LAB — LIPID PANEL
Cholesterol: 124 mg/dL (ref 0–200)
HDL: 42.9 mg/dL (ref >39.00)
LDL Cholesterol: 48 mg/dL (ref 0–99)
NonHDL: 80.94
Total CHOL/HDL Ratio: 3
Triglycerides: 164 mg/dL — ABNORMAL HIGH (ref 0.0–149.0)
VLDL: 32.8 mg/dL (ref 0.0–40.0)

## 2022-11-16 LAB — VITAMIN D 25 HYDROXY (VIT D DEFICIENCY, FRACTURES): VITD: 34.19 ng/mL (ref 30.00–100.00)

## 2022-11-16 LAB — PSA: PSA: 0.39 ng/mL (ref 0.10–4.00)

## 2022-11-16 LAB — TSH: TSH: 2.9 u[IU]/mL (ref 0.35–5.50)

## 2022-11-16 LAB — VITAMIN B12: Vitamin B-12: 356 pg/mL (ref 211–911)

## 2022-11-16 MED ORDER — HYDROCHLOROTHIAZIDE 12.5 MG PO CAPS: 12.5000 mg | ORAL_CAPSULE | Freq: Every day | ORAL | 3 refills | Status: DC

## 2022-11-16 NOTE — Assessment & Plan Note (Signed)
Mild intermittent, for rest and tylenol, consider MRI and/or ortho for worsening

## 2022-11-16 NOTE — Assessment & Plan Note (Signed)
Mild uncontrolled, with trace edema, for hct 21.5 qd

## 2022-11-16 NOTE — Assessment & Plan Note (Signed)
Lab Results  Component Value Date   LDLCALC 42 05/20/2022   Stable, pt to continue current statin crestor 20 mg per day, for f/u lab today

## 2022-11-16 NOTE — Assessment & Plan Note (Signed)
Last vitamin D Lab Results  Component Value Date   VD25OH 45.78 05/20/2022   Stable, cont oral replacement

## 2022-11-16 NOTE — Assessment & Plan Note (Signed)
Age and sex appropriate education and counseling updated with regular exercise and diet Referrals for preventative services - none needed Immunizations addressed - declines covid booster, for prevnar 20 today Smoking counseling  - none needed Evidence for depression or other mood disorder - none significant Most recent labs reviewed. I have personally reviewed and have noted: 1) the patient's medical and social history 2) The patient's current medications and supplements 3) The patient's height, weight, and BMI have been recorded in the chart

## 2022-11-16 NOTE — Progress Notes (Signed)
Patient ID: Carl Gardner, male   DOB: 07/11/1956, 68 y.o.   MRN: [id number]         Chief Complaint:: wellness exam and hyperglycemia, left sciatica, htn with recent leg swelling       HPI:  Carl Gardner is a 67 y.o. male here for wellness exam; declines covid booster, for Prevnar 20 today, o/w up to date                        Also Pt denies chest pain, increased sob or doe, wheezing, orthopnea, PND, palpitations, dizziness or syncope.    Pt denies polydipsia, polyuria, or new focal neuro s/s.    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  Also with 2 mo recent onset mild left sciatica pain, intermittent about twice per wk episodes short time only, better with sitting position, then resumes activities Wt Readings from Last 3 Encounters:  11/16/22 257 lb (116.6 kg)  09/28/22 252 lb (114.3 kg)  08/16/22 248 lb (112.5 kg)   BP Readings from Last 3 Encounters:  11/16/22 134/78  09/28/22 130/88  08/16/22 (!) 142/74   Immunization History  Administered Date(s) Administered   Hepatitis B, adult 05/10/2017   Influenza,inj,Quad PF,6+ Mos 08/12/2016, 08/01/2017, 08/03/2018   Influenza-Unspecified 06/29/2022   PFIZER(Purple Top)SARS-COV-2 Vaccination 11/19/2019, 12/10/2019, 07/21/2020   PNEUMOCOCCAL CONJUGATE-20 11/16/2022   Pneumococcal Polysaccharide-23 09/22/2016   RSV,unspecified 06/29/2022   Tdap 08/12/2016   Zoster Recombinat (Shingrix) 11/18/2020, 01/16/2021   Zoster, Live 08/07/2018   Health Maintenance Due  Topic Date Due   Medicare Annual Wellness (AWV)  Never done      Past Medical History:  Diagnosis Date   Arthritis    hands, knees   BPH (benign prostatic hypertrophy)    Gout    Hepaitis C    Hep C   History of cocaine abuse ([place]) 05/07/2021   HLD (hyperlipidemia)  05/07/2021   Substance abuse ([place])    marijuana   Vitamin D deficiency 05/07/2021   Past Surgical History:  Procedure Laterality Date   SHOULDER ARTHROSCOPY WITH DISTAL CLAVICLE RESECTION Left 06/30/2015   Procedure: SHOULDER ARTHROSCOPY WITH DISTAL CLAVICLE RESECTION; LABRAL TEAR DEBRIDEMENT;  Surgeon: Tania Ade, MD;  Location: [facility];  Service: Orthopedics;  Laterality: Left;   SHOULDER ARTHROSCOPY WITH ROTATOR CUFF REPAIR AND SUBACROMIAL DECOMPRESSION Left 06/30/2015   Procedure: SHOULDER ARTHROSCOPY WITH ARTHROSCOPIC ROTATOR CUFF REPAIR, SUBACROMIAL DECOMPRESSION;  Surgeon: Tania Ade, MD;  Location: [facility];  Service: Orthopedics;  Laterality: Left;   WRIST SURGERY Left     reports that he has never smoked. He has never used smokeless tobacco. He reports current alcohol use. He reports current drug use. Frequency: 3.00 times per week. Drug: Marijuana. family history is not on file. He was adopted. Allergies  Allergen Reactions   Penicillins Hives   Current Outpatient Medications on File Prior to Visit  Medication Sig Dispense Refill   Apoaequorin (PREVAGEN PO) Take by mouth daily.     Ascorbic Acid (VITAMIN C PO) Take by mouth daily.     colchicine 0.6 MG tablet Day one -Take two tablets at once, wait one hour and take another tablet. Day 2 and thereafter Take one tablet daily. 30 tablet 0   finasteride (  PROSCAR) 5 MG tablet Take 1 tablet (5 mg total) by mouth daily. 30 tablet 5   indomethacin (INDOCIN) 50 MG capsule Take 1 capsule (50 mg total) by mouth 3 (three) times daily as needed (gout). 90 capsule 3   Multiple Vitamin (MULTIVITAMIN ADULT PO) Take by mouth daily.     rosuvastatin (CRESTOR) 20 MG tablet Take 1 tablet by mouth once daily 90 tablet 0   tamsulosin (FLOMAX) 0.4 MG CAPS capsule Take 1 capsule by mouth once daily 90 capsule 0   VITAMIN D PO Take by mouth daily.     No current facility-administered medications on file  prior to visit.        ROS:  All others reviewed and negative.  Objective        PE:  BP 134/78 (BP Location: Right Arm, Patient Position: Sitting, Cuff Size: Large)   Pulse 70   Temp 98.1 F (36.7 C) (Oral)   Ht '5\' 8"'$  (1.727 m)   Wt 257 lb (116.6 kg)   SpO2 94%   BMI 39.08 kg/m                 Constitutional: Pt appears in NAD               HENT: Head: NCAT.                Right Ear: External ear normal.                 Left Ear: External ear normal.                Eyes: . Pupils are equal, round, and reactive to light. Conjunctivae and EOM are normal               Nose: without d/c or deformity               Neck: Neck supple. Gross normal ROM               Cardiovascular: Normal rate and regular rhythm.                 Pulmonary/Chest: Effort normal and breath sounds without rales or wheezing.                Abd:  Soft, NT, ND, + BS, no organomegaly               Neurological: Pt is alert. At baseline orientation, motor grossly intact               Skin: Skin is warm. No rashes, no other new lesions, LE edema - none               Psychiatric: Pt behavior is normal without agitation   Micro: none  Cardiac tracings I have personally interpreted today:  none  Pertinent Radiological findings (summarize): none   Lab Results  Component Value Date   WBC 5.3 05/20/2022   HGB 14.3 05/20/2022   HCT 43.0 05/20/2022   PLT 174.0 05/20/2022   GLUCOSE 81 05/20/2022   CHOL 117 05/20/2022   TRIG 151.0 (H) 05/20/2022   HDL 44.60 05/20/2022   LDLCALC 42 05/20/2022   ALT 27 05/20/2022   AST 20 05/20/2022   NA 139 05/20/2022   K 4.1 05/20/2022   CL 103 05/20/2022   CREATININE 1.27 05/20/2022   BUN 18 05/20/2022   CO2 29 05/20/2022   TSH 1.25 05/20/2022   PSA 0.37 05/20/2022  INR 0.9 09/22/2016   HGBA1C 6.1 05/20/2022   Assessment/Plan:  Carl Gardner is a 67 y.o. Black or African American [2] male with  has a past medical history of Arthritis, BPH (benign prostatic  hypertrophy), Gout, Hepaitis C, History of cocaine abuse ([place]) (05/07/2021), HLD (hyperlipidemia) (05/07/2021), Substance abuse ([place]), and Vitamin D deficiency (05/07/2021).  Encounter for routine adult health examination with abnormal findings Age and sex appropriate education and counseling updated with regular exercise and diet Referrals for preventative services - none needed Immunizations addressed - declines covid booster, for prevnar 20 today Smoking counseling  - none needed Evidence for depression or other mood disorder - none significant Most recent labs reviewed. I have personally reviewed and have noted: 1) the patient's medical and social history 2) The patient's current medications and supplements 3) The patient's height, weight, and BMI have been recorded in the chart   HLD (hyperlipidemia) Lab Results  Component Value Date   LDLCALC 42 05/20/2022   Stable, pt to continue current statin crestor 20 mg per day, for f/u lab today   Hyperglycemia Lab Results  Component Value Date   HGBA1C 6.1 05/20/2022   Stable, pt to continue current medical treatment  - diet, wt control, excercise   Left sided sciatica Mild intermittent, for rest and tylenol, consider MRI and/or ortho for worsening  Vitamin D deficiency Last vitamin D Lab Results  Component Value Date   VD25OH 45.78 05/20/2022   Stable, cont oral replacement   HTN (hypertension) Mild uncontrolled, with trace edema, for hct 21.5 qd  Followup: Return in about 6 months (around 05/17/2023).  Cathlean Cower, MD 11/16/2022 4:18 PM [facility]

## 2022-11-16 NOTE — Patient Instructions (Signed)
Please take all new medication as prescribed - the HCT fluid pill at a low dose of 12.5 mg per day  You had the Prevnar 20 pneumonia shot today  Please continue all other medications as before, and refills have been done if requested.  Please have the pharmacy call with any other refills you may need.  Please continue your efforts at being more active, low cholesterol diet, and weight control.  You are otherwise up to date with prevention measures today.  Please keep your appointments with your specialists as you may have planned  Please go to the LAB at the blood drawing area for the tests to be done  You will be contacted by phone if any changes need to be made immediately.  Otherwise, you will receive a letter about your results with an explanation, but please check with MyChart first.  Please remember to sign up for MyChart if you have not done so, as this will be important to you in the future with finding out test results, communicating by private email, and scheduling acute appointments online when needed.  Please make an Appointment to return in 6 months, or sooner if needed

## 2022-11-16 NOTE — Assessment & Plan Note (Signed)
Lab Results  Component Value Date   HGBA1C 6.1 05/20/2022   Stable, pt to continue current medical treatment  - diet, wt control, excercise

## 2022-11-17 ENCOUNTER — Encounter: Payer: Self-pay | Admitting: Internal Medicine

## 2022-11-17 LAB — URINALYSIS, ROUTINE W REFLEX MICROSCOPIC
Bilirubin Urine: NEGATIVE
Ketones, ur: NEGATIVE
Leukocytes,Ua: NEGATIVE
Nitrite: NEGATIVE
Specific Gravity, Urine: 1.025 (ref 1.000–1.030)
Total Protein, Urine: NEGATIVE
Urine Glucose: NEGATIVE
Urobilinogen, UA: 0.2 (ref 0.0–1.0)
pH: 6 (ref 5.0–8.0)

## 2023-01-25 ENCOUNTER — Other Ambulatory Visit: Payer: Self-pay | Admitting: Internal Medicine

## 2023-01-25 DIAGNOSIS — R3911 Hesitancy of micturition: Secondary | ICD-10-CM

## 2023-02-10 ENCOUNTER — Telehealth: Payer: Self-pay | Admitting: Internal Medicine

## 2023-02-10 NOTE — Telephone Encounter (Signed)
Called patient to schedule Medicare Annual Wellness Visit (AWV). Left message for patient to call back and schedule Medicare Annual Wellness Visit (AWV).  AWV-I : 01/17/2023  Please schedule an appointment at any time with [facility] .  If any questions, please contact me at [phone].  Thank you ,  Randon Goldsmith Care Guide [facility] AWV TEAM Direct Dial: [phone]

## 2023-02-14 ENCOUNTER — Telehealth: Payer: Self-pay | Admitting: Internal Medicine

## 2023-02-14 NOTE — Telephone Encounter (Signed)
Contacted Carl Gardner to schedule their annual wellness visit. Appointment made for 02/23/2023.  [facility] Care Guide [facility] AWV TEAM Direct Dial: [phone]

## 2023-02-23 ENCOUNTER — Ambulatory Visit (INDEPENDENT_AMBULATORY_CARE_PROVIDER_SITE_OTHER): Payer: 59

## 2023-02-23 ENCOUNTER — Telehealth: Payer: Self-pay

## 2023-02-23 VITALS — Ht 68.0 in | Wt 248.0 lb

## 2023-02-23 DIAGNOSIS — Z122 Encounter for screening for malignant neoplasm of respiratory organs: Secondary | ICD-10-CM | POA: Diagnosis not present

## 2023-02-23 DIAGNOSIS — R3911 Hesitancy of micturition: Secondary | ICD-10-CM

## 2023-02-23 DIAGNOSIS — Z1339 Encounter for screening examination for other mental health and behavioral disorders: Secondary | ICD-10-CM | POA: Diagnosis not present

## 2023-02-23 DIAGNOSIS — N401 Enlarged prostate with lower urinary tract symptoms: Secondary | ICD-10-CM

## 2023-02-23 DIAGNOSIS — Z0000 Encounter for general adult medical examination without abnormal findings: Secondary | ICD-10-CM

## 2023-02-23 MED ORDER — TAMSULOSIN HCL 0.4 MG PO CAPS: 0.8000 mg | ORAL_CAPSULE | Freq: Every day | ORAL | 2 refills | Status: DC

## 2023-02-23 NOTE — Addendum Note (Signed)
Addended by: Corwin Levins on: 02/23/2023 12:09 PM   Modules accepted: Orders

## 2023-02-23 NOTE — Patient Instructions (Addendum)
Carl Gardner , Thank you for taking time to come for your Medicare Wellness Visit. I appreciate your ongoing commitment to your health goals. Please review the following plan we discussed and let me know if I can assist you in the future.   These are the goals we discussed:  Goals      My goal for 2024 to stay healthy.        This is a list of the screening recommended for you and due dates:  Health Maintenance  Topic Date Due   COVID-19 Vaccine (4 - 2023-24 season) 06/18/2022   Flu Shot  05/19/2023   Medicare Annual Wellness Visit  02/23/2024   DTaP/Tdap/Td vaccine (2 - Td or Tdap) 08/12/2026   Pneumonia Vaccine  Completed   Hepatitis C Screening: USPSTF Recommendation to screen - Ages 14-79 yo.  Completed   Zoster (Shingles) Vaccine  Completed   HPV Vaccine  Aged Out   Cologuard (Stool DNA test)  Discontinued    Advanced directives: No; Advance directive discussed with you today. Even though you declined this today please call our office should you change your mind and we can give you the proper paperwork for you to fill out.  Conditions/risks identified: Yes  Next appointment: Follow up in one year for your annual wellness visit.   Preventive Care 52 Years and Older, Male  Preventive care refers to lifestyle choices and visits with your health care provider that can promote health and wellness. What does preventive care include? A yearly physical exam. This is also called an annual well check. Dental exams once or twice a year. Routine eye exams. Ask your health care provider how often you should have your eyes checked. Personal lifestyle choices, including: Daily care of your teeth and gums. Regular physical activity. Eating a healthy diet. Avoiding tobacco and drug use. Limiting alcohol use. Practicing safe sex. Taking low doses of aspirin every day. Taking vitamin and mineral supplements as recommended by your health care provider. What happens during an annual  well check? The services and screenings done by your health care provider during your annual well check will depend on your age, overall health, lifestyle risk factors, and family history of disease. Counseling  Your health care provider may ask you questions about your: Alcohol use. Tobacco use. Drug use. Emotional well-being. Home and relationship well-being. Sexual activity. Eating habits. History of falls. Memory and ability to understand (cognition). Work and work Astronomer. Screening  You may have the following tests or measurements: Height, weight, and BMI. Blood pressure. Lipid and cholesterol levels. These may be checked every 5 years, or more frequently if you are over 26 years old. Skin check. Lung cancer screening. You may have this screening every year starting at age 45 if you have a 30-pack-year history of smoking and currently smoke or have quit within the past 15 years. Fecal occult blood test (FOBT) of the stool. You may have this test every year starting at age 48. Flexible sigmoidoscopy or colonoscopy. You may have a sigmoidoscopy every 5 years or a colonoscopy every 10 years starting at age 73. Prostate cancer screening. Recommendations will vary depending on your family history and other risks. Hepatitis C blood test. Hepatitis B blood test. Sexually transmitted disease (STD) testing. Diabetes screening. This is done by checking your blood sugar (glucose) after you have not eaten for a while (fasting). You may have this done every 1-3 years. Abdominal aortic aneurysm (AAA) screening. You may need this if you  are a current or former smoker. Osteoporosis. You may be screened starting at age 23 if you are at high risk. Talk with your health care provider about your test results, treatment options, and if necessary, the need for more tests. Vaccines  Your health care provider may recommend certain vaccines, such as: Influenza vaccine. This is recommended every  year. Tetanus, diphtheria, and acellular pertussis (Tdap, Td) vaccine. You may need a Td booster every 10 years. Zoster vaccine. You may need this after age 11. Pneumococcal 13-valent conjugate (PCV13) vaccine. One dose is recommended after age 7. Pneumococcal polysaccharide (PPSV23) vaccine. One dose is recommended after age 89. Talk to your health care provider about which screenings and vaccines you need and how often you need them. This information is not intended to replace advice given to you by your health care provider. Make sure you discuss any questions you have with your health care provider. Document Released: 10/31/2015 Document Revised: 06/23/2016 Document Reviewed: 08/05/2015 Elsevier Interactive Patient Education  2017 [place] Prevention in the Home Falls can cause injuries. They can happen to people of all ages. There are many things you can do to make your home safe and to help prevent falls. What can I do on the outside of my home? Regularly fix the edges of walkways and driveways and fix any cracks. Remove anything that might make you trip as you walk through a door, such as a raised step or threshold. Trim any bushes or trees on the path to your home. Use bright outdoor lighting. Clear any walking paths of anything that might make someone trip, such as rocks or tools. Regularly check to see if handrails are loose or broken. Make sure that both sides of any steps have handrails. Any raised decks and porches should have guardrails on the edges. Have any leaves, snow, or ice cleared regularly. Use sand or salt on walking paths during winter. Clean up any spills in your garage right away. This includes oil or grease spills. What can I do in the bathroom? Use night lights. Install grab bars by the toilet and in the tub and shower. Do not use towel bars as grab bars. Use non-skid mats or decals in the tub or shower. If you need to sit down in the shower, use a  plastic, non-slip stool. Keep the floor dry. Clean up any water that spills on the floor as soon as it happens. Remove soap buildup in the tub or shower regularly. Attach bath mats securely with double-sided non-slip rug tape. Do not have throw rugs and other things on the floor that can make you trip. What can I do in the bedroom? Use night lights. Make sure that you have a light by your bed that is easy to reach. Do not use any sheets or blankets that are too big for your bed. They should not hang down onto the floor. Have a firm chair that has side arms. You can use this for support while you get dressed. Do not have throw rugs and other things on the floor that can make you trip. What can I do in the kitchen? Clean up any spills right away. Avoid walking on wet floors. Keep items that you use a lot in easy-to-reach places. If you need to reach something above you, use a strong step stool that has a grab bar. Keep electrical cords out of the way. Do not use floor polish or wax that makes floors slippery. If you  must use wax, use non-skid floor wax. Do not have throw rugs and other things on the floor that can make you trip. What can I do with my stairs? Do not leave any items on the stairs. Make sure that there are handrails on both sides of the stairs and use them. Fix handrails that are broken or loose. Make sure that handrails are as long as the stairways. Check any carpeting to make sure that it is firmly attached to the stairs. Fix any carpet that is loose or worn. Avoid having throw rugs at the top or bottom of the stairs. If you do have throw rugs, attach them to the floor with carpet tape. Make sure that you have a light switch at the top of the stairs and the bottom of the stairs. If you do not have them, ask someone to add them for you. What else can I do to help prevent falls? Wear shoes that: Do not have high heels. Have rubber bottoms. Are comfortable and fit you  well. Are closed at the toe. Do not wear sandals. If you use a stepladder: Make sure that it is fully opened. Do not climb a closed stepladder. Make sure that both sides of the stepladder are locked into place. Ask someone to hold it for you, if possible. Clearly mark and make sure that you can see: Any grab bars or handrails. First and last steps. Where the edge of each step is. Use tools that help you move around (mobility aids) if they are needed. These include: Canes. Walkers. Scooters. Crutches. Turn on the lights when you go into a dark area. Replace any light bulbs as soon as they burn out. Set up your furniture so you have a clear path. Avoid moving your furniture around. If any of your floors are uneven, fix them. If there are any pets around you, be aware of where they are. Review your medicines with your doctor. Some medicines can make you feel dizzy. This can increase your chance of falling. Ask your doctor what other things that you can do to help prevent falls. This information is not intended to replace advice given to you by your health care provider. Make sure you discuss any questions you have with your health care provider. Document Released: 07/31/2009 Document Revised: 03/11/2016 Document Reviewed: 11/08/2014 Elsevier Interactive Patient Education  2017 [employer].

## 2023-02-23 NOTE — Progress Notes (Addendum)
I connected with  Carl Gardner on 02/23/23 by a audio enabled telemedicine application and verified that I am speaking with the correct person using two identifiers.  Patient Location: Home  Provider Location: Office/Clinic  I discussed the limitations of evaluation and management by telemedicine. The patient expressed understanding and agreed to proceed.  Subjective:   Carl Gardner is a 67 y.o. male who presents for an Initial Medicare Annual Wellness Visit.  Review of Systems     Cardiac Risk Factors include: advanced age (>23men, >19 women);dyslipidemia;hypertension;male gender;obesity (BMI >30kg/m2)     Objective:    Today's Vitals   02/23/23 0919  Weight: 248 lb (112.5 kg)  Height: 5\' 8"  (1.727 m)  PainSc: 0-No pain   Body mass index is 37.71 kg/m.     02/23/2023    9:36 AM 06/30/2015   12:12 PM 06/25/2015    2:30 PM  Advanced Directives  Does Patient Have a Medical Advance Directive? No No No  Would patient like information on creating a medical advance directive? No - Patient declined No - patient declined information     Current Medications (verified) Outpatient Encounter Medications as of 02/23/2023  Medication Sig   Apoaequorin (PREVAGEN PO) Take by mouth daily.   Ascorbic Acid (VITAMIN C PO) Take by mouth daily.   colchicine 0.6 MG tablet Day one -Take two tablets at once, wait one hour and take another tablet. Day 2 and thereafter Take one tablet daily.   finasteride (PROSCAR) 5 MG tablet Take 1 tablet (5 mg total) by mouth daily.   hydrochlorothiazide (MICROZIDE) 12.5 MG capsule Take 1 capsule (12.5 mg total) by mouth daily.   indomethacin (INDOCIN) 50 MG capsule Take 1 capsule (50 mg total) by mouth 3 (three) times daily as needed (gout).   Multiple Vitamin (MULTIVITAMIN ADULT PO) Take by mouth daily.   rosuvastatin (CRESTOR) 20 MG tablet Take 1 tablet by mouth once daily   tamsulosin (FLOMAX) 0.4 MG CAPS capsule Take 1 capsule by mouth once daily    VITAMIN D PO Take by mouth daily.   No facility-administered encounter medications on file as of 02/23/2023.    Allergies (verified) Penicillins   History: Past Medical History:  Diagnosis Date   Arthritis    hands, knees   BPH (benign prostatic hypertrophy)    Gout    Hepaitis C    Hep C   History of cocaine abuse (HCC) 05/07/2021   HLD (hyperlipidemia) 05/07/2021   Substance abuse (HCC)    marijuana   Vitamin D deficiency 05/07/2021   Past Surgical History:  Procedure Laterality Date   SHOULDER ARTHROSCOPY WITH DISTAL CLAVICLE RESECTION Left 06/30/2015   Procedure: SHOULDER ARTHROSCOPY WITH DISTAL CLAVICLE RESECTION; LABRAL TEAR DEBRIDEMENT;  Surgeon: Jones Broom, MD;  Location: [facility];  Service: Orthopedics;  Laterality: Left;   SHOULDER ARTHROSCOPY WITH ROTATOR CUFF REPAIR AND SUBACROMIAL DECOMPRESSION Left 06/30/2015   Procedure: SHOULDER ARTHROSCOPY WITH ARTHROSCOPIC ROTATOR CUFF REPAIR, SUBACROMIAL DECOMPRESSION;  Surgeon: Jones Broom, MD;  Location: [facility];  Service: Orthopedics;  Laterality: Left;   WRIST SURGERY Left    Family History  Adopted: Yes  Problem Relation Age of Onset   Colon cancer Neg Hx    Stomach cancer Neg Hx    Esophageal cancer Neg Hx    Pancreatic cancer Neg Hx    Social History   Socioeconomic History   Marital status: Legally Separated    Spouse name: Not on file  Number of children: 2   Years of education: 12   Highest education level: Not on file  Occupational History   Occupation: Caregiver  Tobacco Use   Smoking status: Never   Smokeless tobacco: Never  Vaping Use   Vaping Use: Never used  Substance and Sexual Activity   Alcohol use: Yes    Comment: occasional   Drug use: Yes    Frequency: 3.0 times per week    Types: Marijuana    Comment: past used cocaine- 25 years ago   Sexual activity: Not Currently  Other Topics Concern   Not on file  Social History Narrative   Fun:  Take care of mom   Social Determinants of Health   Financial Resource Strain: Low Risk  (02/23/2023)   Overall Financial Resource Strain (CARDIA)    Difficulty of Paying Living Expenses: Not hard at all  Food Insecurity: No Food Insecurity (02/23/2023)   Hunger Vital Sign    Worried About Running Out of Food in the Last Year: Never true    Ran Out of Food in the Last Year: Never true  Transportation Needs: No Transportation Needs (02/23/2023)   PRAPARE - Administrator, Civil Service (Medical): No    Lack of Transportation (Non-Medical): No  Physical Activity: Sufficiently Active (02/23/2023)   Exercise Vital Sign    Days of Exercise per Week: 7 days    Minutes of Exercise per Session: 30 min  Stress: No Stress Concern Present (02/23/2023)   [employer] of Occupational Health - Occupational Stress Questionnaire    Feeling of Stress : Not at all  Social Connections: Socially Isolated (02/23/2023)   Social Connection and Isolation Panel [NHANES]    Frequency of Communication with Friends and Family: More than three times a week    Frequency of Social Gatherings with Friends and Family: Never    Attends Religious Services: Never    Database administrator or Organizations: No    Attends Engineer, structural: Never    Marital Status: Separated    Tobacco Counseling Counseling given: Not Answered   Clinical Intake:  Pre-visit preparation completed: Yes  Pain : No/denies pain Pain Score: 0-No pain     BMI - recorded: 37.71 Nutritional Status: BMI > 30  Obese Nutritional Risks: None Diabetes: No  How often do you need to have someone help you when you read instructions, pamphlets, or other written materials from your doctor or pharmacy?: 1 - Never What is the last grade level you completed in school?: GED  Diabetic? NO  Interpreter Needed?: No  Information entered by :: Susie Cassette, LPN.   Activities of Daily Living    02/23/2023    9:29 AM  In  your present state of health, do you have any difficulty performing the following activities:  Hearing? 0  Vision? 0  Difficulty concentrating or making decisions? 0  Walking or climbing stairs? 0  Dressing or bathing? 0  Doing errands, shopping? 0  Preparing Food and eating ? N  Using the Toilet? N  In the past six months, have you accidently leaked urine? N  Do you have problems with loss of bowel control? N  Managing your Medications? N  Managing your Finances? N  Housekeeping or managing your Housekeeping? N    Patient Care Team: Corwin Levins, MD as PCP - General (Internal Medicine) Jenel Lucks, MD as Consulting Physician (Gastroenterology)  Indicate any recent Medical Services you may have received  from other than [facility] providers in the past year (date may be approximate).     Assessment:   This is a routine wellness examination for [place] [place].  Hearing/Vision screen Hearing Screening - Comments:: Denies hearing difficulties   Vision Screening - Comments:: Wears rx glasses - up to date with routine eye exams with Ritesh Poudyal, OD.   Dietary issues and exercise activities discussed: Current Exercise Habits: Home exercise routine, Type of exercise: walking, Time (Minutes): 60, Frequency (Times/Week): 7, Weekly Exercise (Minutes/Week): 420, Intensity: Moderate, Exercise limited by: None identified   Goals Addressed   None   Depression Screen    02/23/2023    9:28 AM 11/16/2022    3:18 PM 05/20/2022    1:55 PM 05/20/2022    1:38 PM 05/07/2021    1:22 PM 05/07/2021    1:05 PM 12/12/2018   10:17 AM  PHQ 2/9 Scores  PHQ - 2 Score 0 0 1 0 0 0 0  PHQ- 9 Score 0   0       Fall Risk    02/23/2023    9:29 AM 11/16/2022    3:18 PM 05/20/2022    1:55 PM 05/20/2022    1:38 PM 05/07/2021    1:22 PM  Fall Risk   Falls in the past year? 0 0 1 1 0  Number falls in past yr: 0 0 0 0 0  Injury with Fall? 0 0 0 0 0  Risk for fall due to : No Fall Risks   No Fall Risks   Follow up  Falls prevention discussed   Falls evaluation completed     FALL RISK PREVENTION PERTAINING TO THE HOME:  Any stairs in or around the home? No  If so, are there any without handrails? No  Home free of loose throw rugs in walkways, pet beds, electrical cords, etc? Yes  Adequate lighting in your home to reduce risk of falls? Yes   ASSISTIVE DEVICES UTILIZED TO PREVENT FALLS:  Life alert? No  Use of a cane, walker or w/c? No  Grab bars in the bathroom? Yes  Shower chair or bench in shower? Yes  Elevated toilet seat or a handicapped toilet? Yes   TIMED UP AND GO:  Was the test performed? No . Telephonic Visit  Cognitive Function:        02/23/2023    9:29 AM  6CIT Screen  What Year? 0 points  What month? 0 points  What time? 0 points  Count back from 20 0 points  Months in reverse 0 points  Repeat phrase 0 points  Total Score 0 points    Immunizations Immunization History  Administered Date(s) Administered   Hepatitis B, ADULT 05/10/2017   Influenza,inj,Quad PF,6+ Mos 08/12/2016, 08/01/2017, 08/03/2018   Influenza-Unspecified 06/29/2022   PFIZER(Purple Top)SARS-COV-2 Vaccination 11/19/2019, 12/10/2019, 07/21/2020   PNEUMOCOCCAL CONJUGATE-20 11/16/2022   Pneumococcal Polysaccharide-23 09/22/2016   RSV,unspecified 06/29/2022   Tdap 08/12/2016   Zoster Recombinat (Shingrix) 11/18/2020, 01/16/2021   Zoster, Live 08/07/2018    TDAP status: Up to date  Flu Vaccine status: Up to date  Pneumococcal vaccine status: Up to date  Covid-19 vaccine status: Completed vaccines  Qualifies for Shingles Vaccine? Yes   Zostavax completed Yes   Shingrix Completed?: Yes  Screening Tests Health Maintenance  Topic Date Due   COVID-19 Vaccine (4 - 2023-24 season) 06/18/2022   INFLUENZA VACCINE  05/19/2023   Medicare Annual Wellness (AWV)  02/23/2024   DTaP/Tdap/Td (2 - Td or Tdap)  08/12/2026   Pneumonia Vaccine 64+ Years old  Completed   Hepatitis C Screening  Completed    Zoster Vaccines- Shingrix  Completed   HPV VACCINES  Aged Out   Fecal DNA (Cologuard)  Discontinued    Health Maintenance  Health Maintenance Due  Topic Date Due   COVID-19 Vaccine (4 - 2023-24 season) 06/18/2022    Colorectal cancer screening: Type of screening: Colonoscopy. Completed 07/14/2022. Repeat every 7 years  Lung Cancer Screening: (Low Dose CT Chest recommended if Age 60-80 years, 30 pack-year currently smoking OR have quit w/in 15years.) does qualify.   Lung Cancer Screening Referral: Ordered 02/23/2023  Additional Screening:  Hepatitis C Screening: does qualify; Completed 05/07/2021  Vision Screening: Recommended annual ophthalmology exams for early detection of glaucoma and other disorders of the eye. Is the patient up to date with their annual eye exam?  Yes  Who is the provider or what is the name of the office in which the patient attends annual eye exams? Ritesh Poudyal, OD. If pt is not established with a provider, would they like to be referred to a provider to establish care? No .   Dental Screening: Recommended annual dental exams for proper oral hygiene  Community Resource Referral / Chronic Care Management: CRR required this visit?  No   CCM required this visit?  No      Plan:     I have personally reviewed and noted the following in the patient's chart:   Medical and social history Use of alcohol, tobacco or illicit drugs  Current medications and supplements including opioid prescriptions. Patient is not currently taking opioid prescriptions. Functional ability and status Nutritional status Physical activity Advanced directives List of other physicians Hospitalizations, surgeries, and ER visits in previous 12 months Vitals Screenings to include cognitive, depression, and falls Referrals and appointments  In addition, I have reviewed and discussed with patient certain preventive protocols, quality metrics, and best practice recommendations. A  written personalized care plan for preventive services as well as general preventive health recommendations were provided to patient.     Mickeal Needy, LPN   10/23/1094   Nurse Notes:  Normal cognitive status assessed by direct observation via telephone conversation by this Nurse Health Advisor. No abnormalities found.   Patient requesting referral to psychiatrist for evaluation of PTSD and high tempered; ex-military.

## 2023-02-23 NOTE — Telephone Encounter (Signed)
Ok this is done, but we should also refer to urology if the symptoms are this bad.   hopefully he will hear soon.   thanks

## 2023-02-23 NOTE — Telephone Encounter (Signed)
Patient would like to increase his Flomax to 2 capsules daily due to hard to urinate. Please contact patient to advised.  Leshia Kope N. Teresa Nicodemus, LPN. [facility] AWV Team Direct Dial: [phone]

## 2023-03-02 ENCOUNTER — Other Ambulatory Visit: Payer: Self-pay

## 2023-03-02 MED ORDER — ROSUVASTATIN CALCIUM 20 MG PO TABS: 20.0000 mg | ORAL_TABLET | Freq: Every day | ORAL | 3 refills | Status: DC

## 2023-03-16 ENCOUNTER — Other Ambulatory Visit: Payer: Self-pay | Admitting: Internal Medicine

## 2023-04-11 ENCOUNTER — Telehealth: Payer: Self-pay

## 2023-04-11 NOTE — Telephone Encounter (Signed)
Spoke with patient by phone regarding lung cancer screening referral.  Patient confirmed he is not a cigarette smoker and has never smoked cigarettes.  Explained he is not a candidate for LDCT if he does not smoke cigarettes.  Vapes, cigars or marijuana would not qualify him for LDCT.  Patient is interested in obtaining a CT chest wo, if recommended by his PCP.  Patient wishes to discuss this or other alternatives.  Advised note will be routed to PCP for review. Referral closed for lung screening.

## 2023-04-29 ENCOUNTER — Other Ambulatory Visit: Payer: Self-pay | Admitting: Internal Medicine

## 2023-04-29 DIAGNOSIS — R3911 Hesitancy of micturition: Secondary | ICD-10-CM

## 2023-05-17 ENCOUNTER — Ambulatory Visit (INDEPENDENT_AMBULATORY_CARE_PROVIDER_SITE_OTHER): Payer: 59 | Admitting: Internal Medicine

## 2023-05-17 ENCOUNTER — Ambulatory Visit (INDEPENDENT_AMBULATORY_CARE_PROVIDER_SITE_OTHER): Payer: 59

## 2023-05-17 ENCOUNTER — Encounter: Payer: Self-pay | Admitting: Internal Medicine

## 2023-05-17 VITALS — BP 132/78 | HR 63 | Temp 98.6°F | Ht 68.0 in | Wt 245.0 lb

## 2023-05-17 DIAGNOSIS — M79641 Pain in right hand: Secondary | ICD-10-CM | POA: Diagnosis not present

## 2023-05-17 DIAGNOSIS — R739 Hyperglycemia, unspecified: Secondary | ICD-10-CM

## 2023-05-17 DIAGNOSIS — I10 Essential (primary) hypertension: Secondary | ICD-10-CM | POA: Diagnosis not present

## 2023-05-17 DIAGNOSIS — M7989 Other specified soft tissue disorders: Secondary | ICD-10-CM | POA: Diagnosis not present

## 2023-05-17 DIAGNOSIS — E7800 Pure hypercholesterolemia, unspecified: Secondary | ICD-10-CM | POA: Diagnosis not present

## 2023-05-17 DIAGNOSIS — F1290 Cannabis use, unspecified, uncomplicated: Secondary | ICD-10-CM

## 2023-05-17 DIAGNOSIS — M79642 Pain in left hand: Secondary | ICD-10-CM

## 2023-05-17 DIAGNOSIS — R079 Chest pain, unspecified: Secondary | ICD-10-CM

## 2023-05-17 DIAGNOSIS — M1811 Unilateral primary osteoarthritis of first carpometacarpal joint, right hand: Secondary | ICD-10-CM | POA: Diagnosis not present

## 2023-05-17 DIAGNOSIS — R072 Precordial pain: Secondary | ICD-10-CM | POA: Diagnosis not present

## 2023-05-17 DIAGNOSIS — M1812 Unilateral primary osteoarthritis of first carpometacarpal joint, left hand: Secondary | ICD-10-CM | POA: Diagnosis not present

## 2023-05-17 DIAGNOSIS — E559 Vitamin D deficiency, unspecified: Secondary | ICD-10-CM | POA: Diagnosis not present

## 2023-05-17 MED ORDER — MELOXICAM 15 MG PO TABS: 15.0000 mg | ORAL_TABLET | Freq: Every day | ORAL | 0 refills | Status: DC | PRN

## 2023-05-17 NOTE — Progress Notes (Signed)
Patient ID: Carl Gardner, male   DOB: May 30, 1956, 67 y.o.   MRN: [id number]        Chief Complaint: follow up chest pain, hld, low vit d, htn, bilateral hand pain, cannabis abuse       HPI:  Carl Gardner is a 67 y.o. male here overall doing ok, c/o tender sore area to left upper parasternal area without rash or swelling, as well as left lateral lower chest wall as well after some lifting recently.  Pt denies other chest pain, increased sob or doe, wheezing, orthopnea, PND, increased LE swelling, palpitations, dizziness or syncope.  Endorses frequent cannabis use but no other illicit drug use or etoh.  Works with hands daily quite hard and has bilateral pain, diffuse non specific swelling and palmar tendon soreness.  Does cooking, brickwork, and yardwork.   Pt denies polydipsia, polyuria, or new focal neuro s/s.    Pt denies fever, wt loss, night sweats, loss of appetite, or other constitutional symptoms   Wt down with better diet.   Wt Readings from Last 3 Encounters:  05/17/23 245 lb (111.1 kg)  02/23/23 248 lb (112.5 kg)  11/16/22 257 lb (116.6 kg)   BP Readings from Last 3 Encounters:  05/17/23 132/78  11/16/22 134/78  09/28/22 130/88         Past Medical History:  Diagnosis Date   Arthritis    hands, knees   BPH (benign prostatic hypertrophy)    Gout    Hepaitis C    Hep C   History of cocaine abuse (HCC) 05/07/2021   HLD (hyperlipidemia) 05/07/2021   Substance abuse (HCC)    marijuana   Vitamin D deficiency 05/07/2021   Past Surgical History:  Procedure Laterality Date   SHOULDER ARTHROSCOPY WITH DISTAL CLAVICLE RESECTION Left 06/30/2015   Procedure: SHOULDER ARTHROSCOPY WITH DISTAL CLAVICLE RESECTION; LABRAL TEAR DEBRIDEMENT;  Surgeon: Jones Broom, MD;  Location: [facility];  Service: Orthopedics;  Laterality: Left;   SHOULDER ARTHROSCOPY WITH ROTATOR CUFF REPAIR AND SUBACROMIAL DECOMPRESSION Left 06/30/2015   Procedure: SHOULDER ARTHROSCOPY WITH  ARTHROSCOPIC ROTATOR CUFF REPAIR, SUBACROMIAL DECOMPRESSION;  Surgeon: Jones Broom, MD;  Location:  SURGERY CENTER;  Service: Orthopedics;  Laterality: Left;   WRIST SURGERY Left     reports that he has never smoked. He has never used smokeless tobacco. He reports current alcohol use. He reports current drug use. Frequency: 3.00 times per week. Drug: Marijuana. family history is not on file. He was adopted. Allergies  Allergen Reactions   Penicillins Hives   Current Outpatient Medications on File Prior to Visit  Medication Sig Dispense Refill   Apoaequorin (PREVAGEN PO) Take by mouth daily.     Ascorbic Acid (VITAMIN C PO) Take by mouth daily.     colchicine 0.6 MG tablet Day one -Take two tablets at once, wait one hour and take another tablet. Day 2 and thereafter Take one tablet daily. 30 tablet 0   finasteride (PROSCAR) 5 MG tablet Take 1 tablet (5 mg total) by mouth daily. 30 tablet 5   hydrochlorothiazide (MICROZIDE) 12.5 MG capsule Take 1 capsule (12.5 mg total) by mouth daily. 90 capsule 3   indomethacin (INDOCIN) 50 MG capsule TAKE 1 CAPSULE BY MOUTH THREE TIMES DAILY AS NEEDED (GOUT). 90 capsule 0   Multiple Vitamin (MULTIVITAMIN ADULT PO) Take by mouth daily.     rosuvastatin (CRESTOR) 20 MG tablet Take 1 tablet (20 mg total) by mouth daily. 90 tablet 3  tamsulosin (FLOMAX) 0.4 MG CAPS capsule Take 2 capsules (0.8 mg total) by mouth daily. 180 capsule 2   VITAMIN D PO Take by mouth daily.     No current facility-administered medications on file prior to visit.        ROS:  All others reviewed and negative.  Objective        PE:  BP 132/78 (BP Location: Left Arm, Patient Position: Sitting, Cuff Size: Normal)   Pulse 63   Temp 98.6 F (37 C) (Oral)   Ht 5\' 8"  (1.727 m)   Wt 245 lb (111.1 kg)   SpO2 98%   BMI 37.25 kg/m                 Constitutional: Pt appears in NAD               HENT: Head: NCAT.                Right Ear: External ear normal.                  Left Ear: External ear normal.                Eyes: . Pupils are equal, round, and reactive to light. Conjunctivae and EOM are normal               Nose: without d/c or deformity               Neck: Neck supple. Gross normal ROM               Cardiovascular: Normal rate and regular rhythm.                 Pulmonary/Chest: Effort normal and breath sounds without rales or wheezing.                Abd:  Soft, NT, ND, + BS, no organomegaly               Neurological: Pt is alert. At baseline orientation, motor grossly intact               Skin: Skin is warm. No rashes, no other new lesions, LE edema - none               Psychiatric: Pt behavior is normal without agitation   Micro: none  Cardiac tracings I have personally interpreted today:  ECG - Sinus brady 55  Pertinent Radiological findings (summarize): none   Lab Results  Component Value Date   WBC 5.2 11/16/2022   HGB 14.7 11/16/2022   HCT 43.1 11/16/2022   PLT 173.0 11/16/2022   GLUCOSE 72 11/16/2022   CHOL 124 11/16/2022   TRIG 164.0 (H) 11/16/2022   HDL 42.90 11/16/2022   LDLCALC 48 11/16/2022   ALT 30 11/16/2022   AST 23 11/16/2022   NA 142 11/16/2022   K 4.0 11/16/2022   CL 104 11/16/2022   CREATININE 1.24 11/16/2022   BUN 13 11/16/2022   CO2 30 11/16/2022   TSH 2.90 11/16/2022   PSA 0.39 11/16/2022   INR 0.9 09/22/2016   HGBA1C 6.0 11/16/2022   Assessment/Plan:  Carl Gardner is a 67 y.o. Black or African American [2] male with  has a past medical history of Arthritis, BPH (benign prostatic hypertrophy), Gout, Hepaitis C, History of cocaine abuse (HCC) (05/07/2021), HLD (hyperlipidemia) (05/07/2021), Substance abuse (HCC), and Vitamin D deficiency (05/07/2021).  HLD (hyperlipidemia) Lab Results  Component Value  Date   LDLCALC 48 11/16/2022   Stable, pt to continue current statin crestor 20 mg qd   Vitamin D deficiency Last vitamin D Lab Results  Component Value Date   VD25OH 34.19 11/16/2022    Low, to start oral replacement   Chest pain Atypical, c/w msk most likely  but will need cxr, ECG reveiwed,  to f/u any worsening symptoms or concerns  Hyperglycemia Lab Results  Component Value Date   HGBA1C 6.0 11/16/2022   Stable, pt to continue current medical treatment  - diet, wt control   HTN (hypertension) BP Readings from Last 3 Encounters:  05/17/23 132/78  11/16/22 134/78  09/28/22 130/88   Stable, pt to continue medical treatment hct 12.5 qd   Bilateral hand pain C/w msk overuse, for mobic prn, bilat hand films, refer hand surgury  Cannabis use disorder Pt counsled to quit  Followup: Return in about 6 months (around 11/17/2023).  Oliver Barre, MD 05/20/2023 9:07 PM [facility] Internal Medicine

## 2023-05-17 NOTE — Patient Instructions (Signed)
Your EKG was done today  Please take all new medication as prescribed - the mobic as needed for anti-inflammatory for hands and chest wall  Please continue all other medications as before, and refills have been done if requested.  Please have the pharmacy call with any other refills you may need.  Please continue your efforts at being more active, low cholesterol diet, and weight control.  You are otherwise up to date with prevention measures today.  Please keep your appointments with your specialists as you may have planned  Please go to the XRAY Department in the first floor for the x-ray testing  You will be contacted regarding the referral for: Hand Surgury  Please make an Appointment to return in 6 months, or sooner if needed

## 2023-05-20 ENCOUNTER — Encounter: Payer: Self-pay | Admitting: Internal Medicine

## 2023-05-20 NOTE — Assessment & Plan Note (Signed)
Lab Results  Component Value Date   HGBA1C 6.0 11/16/2022   Stable, pt to continue current medical treatment  - diet, wt control

## 2023-05-20 NOTE — Assessment & Plan Note (Signed)
Pt counsled to quit

## 2023-05-20 NOTE — Assessment & Plan Note (Signed)
Atypical, c/w msk most likely  but will need cxr, ECG reveiwed,  to f/u any worsening symptoms or concerns

## 2023-05-20 NOTE — Assessment & Plan Note (Addendum)
C/w msk overuse, for mobic prn, bilat hand films, refer hand surgury

## 2023-05-20 NOTE — Assessment & Plan Note (Signed)
Lab Results  Component Value Date   LDLCALC 48 11/16/2022   Stable, pt to continue current statin crestor 20 mg qd

## 2023-05-20 NOTE — Assessment & Plan Note (Signed)
BP Readings from Last 3 Encounters:  05/17/23 132/78  11/16/22 134/78  09/28/22 130/88   Stable, pt to continue medical treatment hct 12.5 qd

## 2023-05-20 NOTE — Assessment & Plan Note (Signed)
Last vitamin D Lab Results  Component Value Date   VD25OH 34.19 11/16/2022   Low, to start oral replacement

## 2023-05-23 ENCOUNTER — Encounter: Payer: Medicaid Other | Admitting: Internal Medicine

## 2023-06-23 ENCOUNTER — Other Ambulatory Visit: Payer: Self-pay | Admitting: Internal Medicine

## 2023-06-24 ENCOUNTER — Other Ambulatory Visit: Payer: Self-pay

## 2023-06-28 DIAGNOSIS — M79641 Pain in right hand: Secondary | ICD-10-CM | POA: Diagnosis not present

## 2023-06-28 DIAGNOSIS — M79642 Pain in left hand: Secondary | ICD-10-CM | POA: Diagnosis not present

## 2023-08-26 ENCOUNTER — Other Ambulatory Visit: Payer: Self-pay | Admitting: Internal Medicine

## 2023-08-26 ENCOUNTER — Other Ambulatory Visit: Payer: Self-pay

## 2023-10-03 ENCOUNTER — Other Ambulatory Visit: Payer: Self-pay | Admitting: Internal Medicine

## 2023-10-03 ENCOUNTER — Other Ambulatory Visit: Payer: Self-pay

## 2023-11-04 ENCOUNTER — Other Ambulatory Visit: Payer: Self-pay | Admitting: Internal Medicine

## 2023-11-04 ENCOUNTER — Other Ambulatory Visit: Payer: Self-pay

## 2023-11-16 ENCOUNTER — Encounter: Payer: Self-pay | Admitting: Internal Medicine

## 2023-11-16 ENCOUNTER — Ambulatory Visit: Payer: 59 | Admitting: Internal Medicine

## 2023-11-16 VITALS — BP 160/90 | HR 74 | Ht 68.0 in | Wt 249.6 lb

## 2023-11-16 DIAGNOSIS — E559 Vitamin D deficiency, unspecified: Secondary | ICD-10-CM

## 2023-11-16 DIAGNOSIS — Z125 Encounter for screening for malignant neoplasm of prostate: Secondary | ICD-10-CM

## 2023-11-16 DIAGNOSIS — E7800 Pure hypercholesterolemia, unspecified: Secondary | ICD-10-CM

## 2023-11-16 DIAGNOSIS — R3911 Hesitancy of micturition: Secondary | ICD-10-CM | POA: Diagnosis not present

## 2023-11-16 DIAGNOSIS — R079 Chest pain, unspecified: Secondary | ICD-10-CM

## 2023-11-16 DIAGNOSIS — R739 Hyperglycemia, unspecified: Secondary | ICD-10-CM

## 2023-11-16 DIAGNOSIS — I10 Essential (primary) hypertension: Secondary | ICD-10-CM

## 2023-11-16 DIAGNOSIS — Z0001 Encounter for general adult medical examination with abnormal findings: Secondary | ICD-10-CM | POA: Diagnosis not present

## 2023-11-16 DIAGNOSIS — E538 Deficiency of other specified B group vitamins: Secondary | ICD-10-CM | POA: Diagnosis not present

## 2023-11-16 LAB — URINALYSIS, ROUTINE W REFLEX MICROSCOPIC
Bilirubin Urine: NEGATIVE
Hgb urine dipstick: NEGATIVE
Ketones, ur: NEGATIVE
Leukocytes,Ua: NEGATIVE
Nitrite: NEGATIVE
Specific Gravity, Urine: 1.02 (ref 1.000–1.030)
Total Protein, Urine: NEGATIVE
Urine Glucose: NEGATIVE
Urobilinogen, UA: 0.2 (ref 0.0–1.0)
pH: 7 (ref 5.0–8.0)

## 2023-11-16 LAB — CBC WITH DIFFERENTIAL/PLATELET
Basophils Absolute: 0 10*3/uL (ref 0.0–0.1)
Basophils Relative: 0.3 % (ref 0.0–3.0)
Eosinophils Absolute: 0.1 10*3/uL (ref 0.0–0.7)
Eosinophils Relative: 1.5 % (ref 0.0–5.0)
HCT: 42.8 % (ref 39.0–52.0)
Hemoglobin: 14.5 g/dL (ref 13.0–17.0)
Lymphocytes Relative: 45.5 % (ref 12.0–46.0)
Lymphs Abs: 2.5 10*3/uL (ref 0.7–4.0)
MCHC: 33.9 g/dL (ref 30.0–36.0)
MCV: 102.1 fL — ABNORMAL HIGH (ref 78.0–100.0)
Monocytes Absolute: 0.5 10*3/uL (ref 0.1–1.0)
Monocytes Relative: 9.3 % (ref 3.0–12.0)
Neutro Abs: 2.4 10*3/uL (ref 1.4–7.7)
Neutrophils Relative %: 43.4 % (ref 43.0–77.0)
Platelets: 167 10*3/uL (ref 150.0–400.0)
RBC: 4.2 Mil/uL — ABNORMAL LOW (ref 4.22–5.81)
RDW: 12.9 % (ref 11.5–15.5)
WBC: 5.5 10*3/uL (ref 4.0–10.5)

## 2023-11-16 LAB — VITAMIN B12: Vitamin B-12: 687 pg/mL (ref 211–911)

## 2023-11-16 LAB — PSA: PSA: 0.49 ng/mL (ref 0.10–4.00)

## 2023-11-16 LAB — BASIC METABOLIC PANEL
BUN: 24 mg/dL — ABNORMAL HIGH (ref 6–23)
CO2: 29 meq/L (ref 19–32)
Calcium: 9.4 mg/dL (ref 8.4–10.5)
Chloride: 104 meq/L (ref 96–112)
Creatinine, Ser: 1.16 mg/dL (ref 0.40–1.50)
GFR: 65.52 mL/min (ref >60.00)
Glucose, Bld: 90 mg/dL (ref 70–99)
Potassium: 4.6 meq/L (ref 3.5–5.1)
Sodium: 140 meq/L (ref 135–145)

## 2023-11-16 LAB — HEPATIC FUNCTION PANEL
ALT: 32 U/L (ref 0–53)
AST: 30 U/L (ref 0–37)
Albumin: 4.8 g/dL (ref 3.5–5.2)
Alkaline Phosphatase: 70 U/L (ref 39–117)
Bilirubin, Direct: 0.1 mg/dL (ref 0.0–0.3)
Total Bilirubin: 0.6 mg/dL (ref 0.2–1.2)
Total Protein: 7.2 g/dL (ref 6.0–8.3)

## 2023-11-16 LAB — LIPID PANEL
Cholesterol: 107 mg/dL (ref 0–200)
HDL: 40.1 mg/dL
LDL Cholesterol: 47 mg/dL (ref 0–99)
NonHDL: 66.91
Total CHOL/HDL Ratio: 3
Triglycerides: 98 mg/dL (ref 0.0–149.0)
VLDL: 19.6 mg/dL (ref 0.0–40.0)

## 2023-11-16 LAB — TSH: TSH: 1.66 u[IU]/mL (ref 0.35–5.50)

## 2023-11-16 LAB — VITAMIN D 25 HYDROXY (VIT D DEFICIENCY, FRACTURES): VITD: 57.52 ng/mL (ref 30.00–100.00)

## 2023-11-16 LAB — HEMOGLOBIN A1C: Hgb A1c MFr Bld: 6.2 % (ref 4.6–6.5)

## 2023-11-16 MED ORDER — TAMSULOSIN HCL 0.4 MG PO CAPS
0.8000 mg | ORAL_CAPSULE | Freq: Every day | ORAL | 3 refills | Status: AC
Start: 2023-11-16 — End: ?

## 2023-11-16 MED ORDER — ROSUVASTATIN CALCIUM 20 MG PO TABS: 20.0000 mg | ORAL_TABLET | Freq: Every day | ORAL | 3 refills | Status: DC

## 2023-11-16 MED ORDER — HYDROCHLOROTHIAZIDE 12.5 MG PO CAPS: 12.5000 mg | ORAL_CAPSULE | Freq: Every day | ORAL | 3 refills | Status: AC

## 2023-11-16 NOTE — Progress Notes (Signed)
Patient ID: Carl Gardner, male   DOB: 01-Mar-1956, 68 y.o.   MRN: [id number]         Chief Complaint:: wellness exam and Medical Management of Chronic Issues (Following up on chest pain from July, chest pain still present. Dull pain, on left side. Thinks it may be indigestion, or from using tobacco products when smoking cannabis.)  , htn, hld, hyperglycemia, low vit d       HPI:  Carl Gardner is a 68 y.o. male here for wellness exam; declines covid booster, o/w up to date                        Also SBP has been increased recently by left wrist cuff, but also suspects more salt in his diet. Not working now in his off season for [employer], admits to more cakes and cookies lately.  Also with 2 wks onset worsening left chest pain, dull and sharp, mild intermittent, not clearly pleuritic,positional, exertional; Pt denies increased sob or doe, wheezing, orthopnea, PND, increased LE swelling, palpitations, dizziness or syncope.  Pt denies polydipsia, polyuria, or new focal neuro s/s.    Pt denies fever, wt loss, night sweats, loss of appetite, or other constitutional symptoms       Wt Readings from Last 3 Encounters:  11/16/23 249 lb 9.6 oz (113.2 kg)  05/17/23 245 lb (111.1 kg)  02/23/23 248 lb (112.5 kg)   BP Readings from Last 3 Encounters:  11/16/23 (!) 160/90  05/17/23 132/78  11/16/22 134/78   Immunization History  Administered Date(s) Administered   Hepatitis B, ADULT 05/10/2017   Influenza,inj,Quad PF,6+ Mos 08/12/2016, 08/01/2017, 08/03/2018   Influenza-Unspecified 06/29/2022, 08/29/2023   PFIZER(Purple Top)SARS-COV-2 Vaccination 11/19/2019, 12/10/2019, 07/21/2020   PNEUMOCOCCAL CONJUGATE-20 11/16/2022   Pneumococcal Polysaccharide-23 09/22/2016   RSV,unspecified 06/29/2022   Td 10/31/2023   Tdap 08/12/2016   Zoster Recombinant(Shingrix) 11/18/2020, 01/16/2021   Zoster, Live 08/07/2018   There are no preventive care reminders to display for this patient.     Past  Medical History:  Diagnosis Date   Arthritis    hands, knees   BPH (benign prostatic hypertrophy)    Gout    Hepaitis C    Hep C   History of cocaine abuse (HCC) 05/07/2021   HLD (hyperlipidemia) 05/07/2021   Substance abuse (HCC)    marijuana   Vitamin D deficiency 05/07/2021   Past Surgical History:  Procedure Laterality Date   SHOULDER ARTHROSCOPY WITH DISTAL CLAVICLE RESECTION Left 06/30/2015   Procedure: SHOULDER ARTHROSCOPY WITH DISTAL CLAVICLE RESECTION; LABRAL TEAR DEBRIDEMENT;  Surgeon: Jones Broom, MD;  Location: [facility];  Service: Orthopedics;  Laterality: Left;   SHOULDER ARTHROSCOPY WITH ROTATOR CUFF REPAIR AND SUBACROMIAL DECOMPRESSION Left 06/30/2015   Procedure: SHOULDER ARTHROSCOPY WITH ARTHROSCOPIC ROTATOR CUFF REPAIR, SUBACROMIAL DECOMPRESSION;  Surgeon: Jones Broom, MD;  Location: [facility];  Service: Orthopedics;  Laterality: Left;   WRIST SURGERY Left     reports that he has never smoked. He has never used smokeless tobacco. He reports current alcohol use. He reports current drug use. Frequency: 3.00 times per week. Drug: Marijuana. family history is not on file. He was adopted. Allergies  Allergen Reactions   Penicillins Hives   Current Outpatient Medications on File Prior to Visit  Medication Sig Dispense Refill   Apoaequorin (PREVAGEN PO) Take by mouth daily.     Ascorbic Acid (VITAMIN C PO) Take by mouth daily.  colchicine 0.6 MG tablet Day one -Take two tablets at once, wait one hour and take another tablet. Day 2 and thereafter Take one tablet daily. 30 tablet 0   finasteride (PROSCAR) 5 MG tablet Take 1 tablet (5 mg total) by mouth daily. 30 tablet 5   indomethacin (INDOCIN) 50 MG capsule TAKE 1 CAPSULE BY MOUTH THREE TIMES DAILY AS NEEDED FOR GOUT 90 capsule 3   meloxicam (MOBIC) 15 MG tablet TAKE 1 TABLET BY MOUTH ONCE DAILY AS NEEDED FOR PAIN 90 tablet 3   Multiple Vitamin (MULTIVITAMIN ADULT PO) Take by  mouth daily.     VITAMIN D PO Take by mouth daily.     No current facility-administered medications on file prior to visit.        ROS:  All others reviewed and negative.  Objective        PE:  BP (!) 160/90 (BP Location: Left Arm, Patient Position: Sitting, Cuff Size: Large)   Pulse 74   Ht 5\' 8"  (1.727 m)   Wt 249 lb 9.6 oz (113.2 kg)   SpO2 96%   BMI 37.95 kg/m                 Constitutional: Pt appears in NAD               HENT: Head: NCAT.                Right Ear: External ear normal.                 Left Ear: External ear normal.                Eyes: . Pupils are equal, round, and reactive to light. Conjunctivae and EOM are normal               Nose: without d/c or deformity               Neck: Neck supple. Gross normal ROM               Cardiovascular: Normal rate and regular rhythm.                 Pulmonary/Chest: Effort normal and breath sounds without rales or wheezing.                Abd:  Soft, NT, ND, + BS, no organomegaly               Neurological: Pt is alert. At baseline orientation, motor grossly intact               Skin: Skin is warm. No rashes, no other new lesions, LE edema - none               Psychiatric: Pt behavior is normal without agitation   Micro: none  Cardiac tracings I have personally interpreted today:  none  Pertinent Radiological findings (summarize): none   Lab Results  Component Value Date   WBC 5.5 11/16/2023   HGB 14.5 11/16/2023   HCT 42.8 11/16/2023   PLT 167.0 11/16/2023   GLUCOSE 90 11/16/2023   CHOL 107 11/16/2023   TRIG 98.0 11/16/2023   HDL 40.10 11/16/2023   LDLCALC 47 11/16/2023   ALT 32 11/16/2023   AST 30 11/16/2023   NA 140 11/16/2023   K 4.6 11/16/2023   CL 104 11/16/2023   CREATININE 1.16 11/16/2023   BUN 24 (H) 11/16/2023   CO2 29 11/16/2023  TSH 1.66 11/16/2023   PSA 0.49 11/16/2023   INR 0.9 09/22/2016   HGBA1C 6.2 11/16/2023   Assessment/Plan:  Carl Gardner is a 68 y.o. Black or African  American [2] male with  has a past medical history of Arthritis, BPH (benign prostatic hypertrophy), Gout, Hepaitis C, History of cocaine abuse (HCC) (05/07/2021), HLD (hyperlipidemia) (05/07/2021), Substance abuse (HCC), and Vitamin D deficiency (05/07/2021).  Encounter for routine adult health examination with abnormal findings Age and sex appropriate education and counseling updated with regular exercise and diet Referrals for preventative services - none needed Immunizations addressed - declines covid booster Smoking counseling  - none needed Evidence for depression or other mood disorder - none significant Most recent labs reviewed. I have personally reviewed and have noted: 1) the patient's medical and social history 2) The patient's current medications and supplements 3) The patient's height, weight, and BMI have been recorded in the chart   HLD (hyperlipidemia) Lab Results  Component Value Date   LDLCALC 47 11/16/2023   Stable, pt to continue current statin crestor 20 qd   HTN (hypertension) BP Readings from Last 3 Encounters:  11/16/23 (!) 160/90  05/17/23 132/78  11/16/22 134/78   uncontrolled, pt states controlled at home, pt to continue medical treatment hct 12.5 every day,    Hyperglycemia Lab Results  Component Value Date   HGBA1C 6.2 11/16/2023   Stable, pt to continue current medical treatment  - diet,wt control   Vitamin D deficiency Last vitamin D Lab Results  Component Value Date   VD25OH 57.52 11/16/2023   Stable, cont oral replacement   Chest pain Atypical,, can't r/o cardiac, for exercise stress test  Followup: Return in about 1 year (around 11/15/2024).  Oliver Barre, MD 11/20/2023 5:37 PM [facility]  Primary Care - [facility]

## 2023-11-16 NOTE — Patient Instructions (Signed)
Please continue all other medications as before, and refills have been done if requested.  Please have the pharmacy call with any other refills you may need.  Please continue your efforts at being more active, low cholesterol diet, and weight control.  You are otherwise up to date with prevention measures today.  Please keep your appointments with your specialists as you may have planned  You will be contacted regarding the referral for: heart stress test  Please go to the LAB at the blood drawing area for the tests to be done  You will be contacted by phone if any changes need to be made immediately.  Otherwise, you will receive a letter about your results with an explanation, but please check with MyChart first.  Please make an Appointment to return for your 1 year visit, or sooner if needed

## 2023-11-18 ENCOUNTER — Telehealth (HOSPITAL_COMMUNITY): Payer: Self-pay | Admitting: *Deleted

## 2023-11-18 NOTE — Telephone Encounter (Signed)
Left message on voicemail per DPR in reference to upcoming appointment scheduled on 11/23/2023 at 7:30 with detailed instructions given per Myocardial Perfusion Study Information Sheet for the test. LM to arrive 15 minutes early, and that it is imperative to arrive on time for appointment to keep from having the test rescheduled. If you need to cancel or reschedule your appointment, please call the office within 24 hours of your appointment. Failure to do so may result in a cancellation of your appointment, and a $50 no show fee. Phone number given for call back for any questions.

## 2023-11-20 ENCOUNTER — Encounter: Payer: Self-pay | Admitting: Internal Medicine

## 2023-11-20 NOTE — Assessment & Plan Note (Signed)
Lab Results  Component Value Date   LDLCALC 47 11/16/2023   Stable, pt to continue current statin crestor 20 qd

## 2023-11-20 NOTE — Assessment & Plan Note (Signed)
BP Readings from Last 3 Encounters:  11/16/23 (!) 160/90  05/17/23 132/78  11/16/22 134/78   uncontrolled, pt states controlled at home, pt to continue medical treatment hct 12.5 every day,

## 2023-11-20 NOTE — Assessment & Plan Note (Signed)
Lab Results  Component Value Date   HGBA1C 6.2 11/16/2023   Stable, pt to continue current medical treatment  - diet,wt control

## 2023-11-20 NOTE — Assessment & Plan Note (Signed)

## 2023-11-20 NOTE — Assessment & Plan Note (Signed)
Last vitamin D Lab Results  Component Value Date   VD25OH 57.52 11/16/2023   Stable, cont oral replacement

## 2023-11-20 NOTE — Assessment & Plan Note (Signed)
Atypical,, can't r/o cardiac, for exercise stress test

## 2023-11-23 ENCOUNTER — Encounter: Payer: Self-pay | Admitting: Internal Medicine

## 2023-11-23 ENCOUNTER — Ambulatory Visit (HOSPITAL_COMMUNITY): Payer: 59 | Attending: Internal Medicine

## 2023-11-23 DIAGNOSIS — R079 Chest pain, unspecified: Secondary | ICD-10-CM | POA: Diagnosis not present

## 2023-11-23 LAB — MYOCARDIAL PERFUSION IMAGING
Angina Index: 0
Base ST Depression (mm): 0 mm
Duke Treadmill Score: 5
Estimated workload: 6.7
Exercise duration (min): 4 min
Exercise duration (sec): 45 s
LV dias vol: 95 mL (ref 62–150)
LV sys vol: 40 mL
MPHR: 154 {beats}/min
Nuc Stress EF: 57 %
Peak HR: 141 {beats}/min
Percent HR: 91 %
Rest HR: 57 {beats}/min
Rest Nuclear Isotope Dose: 10.6 mCi
SDS: 0
SRS: 0
SSS: 0
ST Depression (mm): 0 mm
Stress Nuclear Isotope Dose: 30.1 mCi
TID: 0.93

## 2023-11-23 MED ORDER — TECHNETIUM TC 99M TETROFOSMIN IV KIT
30.1000 | PACK | Freq: Once | INTRAVENOUS | Status: AC | PRN
Start: 2023-11-23 — End: 2023-11-23
  Administered 2023-11-23: 30.1 via INTRAVENOUS

## 2023-11-23 MED ORDER — TECHNETIUM TC 99M TETROFOSMIN IV KIT
10.6000 | PACK | Freq: Once | INTRAVENOUS | Status: AC | PRN
  Administered 2023-11-23: 10.6 via INTRAVENOUS

## 2024-02-27 ENCOUNTER — Ambulatory Visit (INDEPENDENT_AMBULATORY_CARE_PROVIDER_SITE_OTHER): Payer: 59

## 2024-02-27 VITALS — Ht 68.5 in | Wt 249.0 lb

## 2024-02-27 DIAGNOSIS — Z0000 Encounter for general adult medical examination without abnormal findings: Secondary | ICD-10-CM | POA: Diagnosis not present

## 2024-02-27 NOTE — Patient Instructions (Addendum)
 Carl Gardner , Thank you for taking time out of your busy schedule to complete your Annual Wellness Visit with me. I enjoyed our conversation and look forward to speaking with you again next year. I, as well as your care team,  appreciate your ongoing commitment to your health goals. Please review the following plan we discussed and let me know if I can assist you in the future. Your Game plan/ To Do List    Follow up Visits: Next Medicare AWV with our clinical staff: 02/27/2025   Have you seen your provider in the last 6 months (3 months if uncontrolled diabetes)? No Next Office Visit with your provider: 05/23/2024 for a Physical w/Dr Rosalia Colonel  Clinician Recommendations:  Aim for 30 minutes of exercise or brisk walking, 6-8 glasses of water, and 5 servings of fruits and vegetables each day. Educated and advised on getting the COVID vaccine in 2025.      This is a list of the screening recommended for you and due dates:  Health Maintenance  Topic Date Due   COVID-19 Vaccine (4 - 2024-25 season) 06/19/2023   Flu Shot  05/18/2024   Medicare Annual Wellness Visit  02/26/2025   Colon Cancer Screening  07/14/2032   DTaP/Tdap/Td vaccine (3 - Td or Tdap) 10/30/2033   Pneumonia Vaccine  Completed   Hepatitis C Screening  Completed   Zoster (Shingles) Vaccine  Completed   HPV Vaccine  Aged Out   Meningitis B Vaccine  Aged Out   Cologuard (Stool DNA test)  Discontinued    Advanced directives: (Provided) Advance directive discussed with you today. I have provided a copy for you to complete at home and have notarized. Once this is complete, please bring a copy in to our office so we can scan it into your chart.  Advance Care Planning is important because it:  [x]  Makes sure you receive the medical care that is consistent with your values, goals, and preferences  [x]  It provides guidance to your family and loved ones and reduces their decisional burden about whether or not they are making the  right decisions based on your wishes.  Follow the link provided in your after visit summary or read over the paperwork we have mailed to you to help you started getting your Advance Directives in place. If you need assistance in completing these, please reach out to us  so that we can help you!

## 2024-02-27 NOTE — Progress Notes (Addendum)
 Subjective:  Please attest and cosign this visit due to patients primary care provider not being in the office at the time the visit was completed.  (Pt of Dr Rosalia Colonel)   Carl Gardner is a 68 y.o. who presents for a Medicare Wellness preventive visit.  As a reminder, Annual Wellness Visits don't include a physical exam, and some assessments may be limited, especially if this visit is performed virtually. We may recommend an in-person visit if needed.  Visit Complete: Virtual I connected with  Carl Gardner on 02/27/24 by a audio enabled telemedicine application and verified that I am speaking with the correct person using two identifiers.  Patient Location: Home  Provider Location: Office/Clinic  I discussed the limitations of evaluation and management by telemedicine. The patient expressed understanding and agreed to proceed.  Vital Signs: Because this visit was a virtual/telehealth visit, some criteria may be missing or patient reported. Any vitals not documented were not able to be obtained and vitals that have been documented are patient reported.  VideoDeclined- This patient declined Librarian, academic. Therefore the visit was completed with audio only.  Persons Participating in Visit: Patient.  AWV Questionnaire: No: Patient Medicare AWV questionnaire was not completed prior to this visit.  Cardiac Risk Factors include: advanced age (>36men, >44 women);male gender;hypertension;dyslipidemia;obesity (BMI >30kg/m2)     Objective:     Today's Vitals   02/27/24 0844  Weight: 249 lb (112.9 kg)  Height: 5' 8.5" (1.74 m)   Body mass index is 37.31 kg/m.     02/27/2024    8:44 AM 02/23/2023    9:36 AM 06/30/2015   12:12 PM 06/25/2015    2:30 PM  Advanced Directives  Does Patient Have a Medical Advance Directive? No No No No  Would patient like information on creating a medical advance directive? No - Patient declined No - Patient  declined No - patient declined information     Current Medications (verified) Outpatient Encounter Medications as of 02/27/2024  Medication Sig   Apoaequorin (PREVAGEN PO) Take by mouth daily.   Ascorbic Acid (VITAMIN C PO) Take by mouth daily.   colchicine  0.6 MG tablet Day one -Take two tablets at once, wait one hour and take another tablet. Day 2 and thereafter Take one tablet daily.   finasteride  (PROSCAR ) 5 MG tablet Take 1 tablet (5 mg total) by mouth daily.   hydrochlorothiazide  (MICROZIDE ) 12.5 MG capsule Take 1 capsule (12.5 mg total) by mouth daily.   indomethacin  (INDOCIN ) 50 MG capsule TAKE 1 CAPSULE BY MOUTH THREE TIMES DAILY AS NEEDED FOR GOUT   meloxicam  (MOBIC ) 15 MG tablet TAKE 1 TABLET BY MOUTH ONCE DAILY AS NEEDED FOR PAIN   Multiple Vitamin (MULTIVITAMIN ADULT PO) Take by mouth daily.   rosuvastatin  (CRESTOR ) 20 MG tablet Take 1 tablet (20 mg total) by mouth daily.   tamsulosin  (FLOMAX ) 0.4 MG CAPS capsule Take 2 capsules (0.8 mg total) by mouth daily.   VITAMIN D  PO Take by mouth daily.   No facility-administered encounter medications on file as of 02/27/2024.    Allergies (verified) Penicillins   History: Past Medical History:  Diagnosis Date   Arthritis    hands, knees   BPH (benign prostatic hypertrophy)    Gout    Hepaitis C    Hep C   History of cocaine abuse (HCC) 05/07/2021   HLD (hyperlipidemia) 05/07/2021   Substance abuse (HCC)    marijuana   Vitamin D  deficiency  05/07/2021   Past Surgical History:  Procedure Laterality Date   SHOULDER ARTHROSCOPY WITH DISTAL CLAVICLE RESECTION Left 06/30/2015   Procedure: SHOULDER ARTHROSCOPY WITH DISTAL CLAVICLE RESECTION; LABRAL TEAR DEBRIDEMENT;  Surgeon: Sammye Cristal, MD;  Location: [facility];  Service: Orthopedics;  Laterality: Left;   SHOULDER ARTHROSCOPY WITH ROTATOR CUFF REPAIR AND SUBACROMIAL DECOMPRESSION Left 06/30/2015   Procedure: SHOULDER ARTHROSCOPY WITH ARTHROSCOPIC ROTATOR  CUFF REPAIR, SUBACROMIAL DECOMPRESSION;  Surgeon: Sammye Cristal, MD;  Location: [facility];  Service: Orthopedics;  Laterality: Left;   WRIST SURGERY Left    Family History  Adopted: Yes  Problem Relation Age of Onset   Colon cancer Neg Hx    Stomach cancer Neg Hx    Esophageal cancer Neg Hx    Pancreatic cancer Neg Hx    Social History   Socioeconomic History   Marital status: Divorced    Spouse name: Not on file   Number of children: 2   Years of education: 12   Highest education level: Not on file  Occupational History   Occupation: Caregiver  Tobacco Use   Smoking status: Never    Passive exposure: Never   Smokeless tobacco: Never  Vaping Use   Vaping status: Never Used  Substance and Sexual Activity   Alcohol use: Yes    Comment: occasional   Drug use: Yes    Frequency: 3.0 times per week    Types: Marijuana    Comment: past used cocaine- 25 years ago   Sexual activity: Not Currently  Other Topics Concern   Not on file  Social History Narrative   Fun: Take care of mom   Social Drivers of Health   Financial Resource Strain: Low Risk  (02/27/2024)   Overall Financial Resource Strain (CARDIA)    Difficulty of Paying Living Expenses: Not hard at all  Food Insecurity: No Food Insecurity (02/27/2024)   Hunger Vital Sign    Worried About Running Out of Food in the Last Year: Never true    Ran Out of Food in the Last Year: Never true  Transportation Needs: No Transportation Needs (02/27/2024)   PRAPARE - Administrator, Civil Service (Medical): No    Lack of Transportation (Non-Medical): No  Physical Activity: Sufficiently Active (02/27/2024)   Exercise Vital Sign    Days of Exercise per Week: 3 days    Minutes of Exercise per Session: 60 min  Stress: No Stress Concern Present (02/27/2024)   [employer] of Occupational Health - Occupational Stress Questionnaire    Feeling of Stress : Not at all  Social Connections: Socially  Isolated (02/27/2024)   Social Connection and Isolation Panel [NHANES]    Frequency of Communication with Friends and Family: More than three times a week    Frequency of Social Gatherings with Friends and Family: More than three times a week    Attends Religious Services: Never    Database administrator or Organizations: No    Attends Engineer, structural: Never    Marital Status: Divorced    Tobacco Counseling Counseling given: No    Clinical Intake:  Pre-visit preparation completed: Yes  Pain : No/denies pain     BMI - recorded: 37.31 Nutritional Risks: None Diabetes: No  Lab Results  Component Value Date   HGBA1C 6.2 11/16/2023   HGBA1C 6.0 11/16/2022   HGBA1C 6.1 05/20/2022     How often do you need to have someone help you when you read  instructions, pamphlets, or other written materials from your doctor or pharmacy?: 1 - Never  Interpreter Needed?: No  Information entered by :: Kandy Orris, CMA   Activities of Daily Living     02/27/2024    8:46 AM  In your present state of health, do you have any difficulty performing the following activities:  Hearing? 0  Vision? 0  Difficulty concentrating or making decisions? 0  Walking or climbing stairs? 0  Dressing or bathing? 0  Doing errands, shopping? 0  Preparing Food and eating ? N  Using the Toilet? N  In the past six months, have you accidently leaked urine? N  Do you have problems with loss of bowel control? N  Managing your Medications? N  Managing your Finances? N  Housekeeping or managing your Housekeeping? N    Patient Care Team: Roslyn Coombe, MD as PCP - General (Internal Medicine) Elois Hair, MD as Consulting Physician (Gastroenterology)  Indicate any recent Medical Services you may have received from other than [facility] providers in the past year (date may be approximate).     Assessment:    This is a routine wellness examination for Carl Gardner.  Hearing/Vision  screen Hearing Screening - Comments:: Denies hearing difficulties   Vision Screening - Comments:: Wears rx glasses - pt to schedule an appt w/Ophthalmologist for 2025   Goals Addressed               This Visit's Progress     Patient Stated (pt-stated)        Patient stated he plans to continue being healthy and exercising.         Depression Screen     02/27/2024    8:54 AM 05/17/2023   10:00 AM 02/23/2023    9:28 AM 11/16/2022    3:18 PM 05/20/2022    1:55 PM 05/20/2022    1:38 PM 05/07/2021    1:22 PM  PHQ 2/9 Scores  PHQ - 2 Score 0 0 0 0 1 0 0  PHQ- 9 Score 0  0   0     Fall Risk     02/27/2024    8:51 AM 05/17/2023   10:00 AM 02/23/2023    9:29 AM 11/16/2022    3:18 PM 05/20/2022    1:55 PM  Fall Risk   Falls in the past year? 0 0 0 0 1  Number falls in past yr: 0 0 0 0 0  Injury with Fall? 0 0 0 0 0  Risk for fall due to : No Fall Risks No Fall Risks No Fall Risks    Follow up Falls prevention discussed;Falls evaluation completed Falls evaluation completed Falls prevention discussed      MEDICARE RISK AT HOME:  Medicare Risk at Home Any stairs in or around the home?: Yes (outside) If so, are there any without handrails?: No Home free of loose throw rugs in walkways, pet beds, electrical cords, etc?: Yes Adequate lighting in your home to reduce risk of falls?: Yes Life alert?: No Use of a cane, walker or w/c?: No Grab bars in the bathroom?: Yes Shower chair or bench in shower?: Yes Elevated toilet seat or a handicapped toilet?: Yes  TIMED UP AND GO:  Was the test performed?  No  Cognitive Function: 6CIT completed        02/27/2024    8:53 AM 02/23/2023    9:29 AM  6CIT Screen  What Year? 0 points 0 points  What month?  0 points 0 points  What time? 0 points 0 points  Count back from 20 0 points 0 points  Months in reverse 0 points 0 points  Repeat phrase 0 points 0 points  Total Score 0 points 0 points    Immunizations Immunization History   Administered Date(s) Administered   Hepatitis B, ADULT 05/10/2017   Influenza,inj,Quad PF,6+ Mos 08/12/2016, 08/01/2017, 08/03/2018   Influenza-Unspecified 06/29/2022, 08/29/2023   PFIZER(Purple Top)SARS-COV-2 Vaccination 11/19/2019, 12/10/2019, 07/21/2020   PNEUMOCOCCAL CONJUGATE-20 11/16/2022   Pneumococcal Polysaccharide-23 09/22/2016   RSV,unspecified 06/29/2022   Td 10/31/2023   Tdap 08/12/2016   Zoster Recombinant(Shingrix) 11/18/2020, 01/16/2021   Zoster, Live 08/07/2018    Screening Tests Health Maintenance  Topic Date Due   COVID-19 Vaccine (4 - 2024-25 season) 06/19/2023   INFLUENZA VACCINE  05/18/2024   Medicare Annual Wellness (AWV)  02/26/2025   Colonoscopy  07/14/2032   DTaP/Tdap/Td (3 - Td or Tdap) 10/30/2033   Pneumonia Vaccine 67+ Years old  Completed   Hepatitis C Screening  Completed   Zoster Vaccines- Shingrix  Completed   HPV VACCINES  Aged Out   Meningococcal B Vaccine  Aged Out   Fecal DNA (Cologuard)  Discontinued    Health Maintenance  Health Maintenance Due  Topic Date Due   COVID-19 Vaccine (4 - 2024-25 season) 06/19/2023   Health Maintenance Items Addressed: 02/27/2024   Additional Screening:  Vision Screening: Recommended annual ophthalmology exams for early detection of glaucoma and other disorders of the eye.  Dental Screening: Recommended annual dental exams for proper oral hygiene  Community Resource Referral / Chronic Care Management: CRR required this visit?  No   CCM required this visit?  No   Plan:    I have personally reviewed and noted the following in the patient's chart:   Medical and social history Use of alcohol, tobacco or illicit drugs  Current medications and supplements including opioid prescriptions. Patient is not currently taking opioid prescriptions. Functional ability and status Nutritional status Physical activity Advanced directives List of other physicians Hospitalizations, surgeries, and ER visits  in previous 12 months Vitals Screenings to include cognitive, depression, and falls Referrals and appointments  In addition, I have reviewed and discussed with patient certain preventive protocols, quality metrics, and best practice recommendations. A written personalized care plan for preventive services as well as general preventive health recommendations were provided to patient.   Patria Bookbinder, CMA   02/27/2024   After Visit Summary: (Mail) Due to this being a telephonic visit, the after visit summary with patients personalized plan was offered to patient via mail   Notes: Nothing significant to report at this time.

## 2024-05-23 ENCOUNTER — Encounter: Admitting: Internal Medicine

## 2024-07-23 DIAGNOSIS — H524 Presbyopia: Secondary | ICD-10-CM | POA: Diagnosis not present

## 2024-08-01 ENCOUNTER — Telehealth: Payer: Self-pay

## 2024-08-01 LAB — AMB RESULTS CONSOLE CBG: Glucose: 131

## 2024-08-01 NOTE — Telephone Encounter (Signed)
 Copied from CRM #[id number]. Topic: General - Other >> Aug 01, 2024  1:08 PM Carl Gardner wrote: Reason for CRM: Pt stated that he attended a health screening at [facility] clinic. Pt stated that they stated his BP was 158/90 and that they would informed the provider. Pt would like to know if he needs to schedule an appt to see Dr.John or if Dr.John would like to see him in regards to this concern.

## 2024-08-01 NOTE — Progress Notes (Signed)
 Pt has PCP and no SDOH needs at this time.Pt given education surrounding HTN.

## 2024-08-01 NOTE — Telephone Encounter (Signed)
 Pt has been scheduled.

## 2024-08-01 NOTE — Telephone Encounter (Signed)
 Yes, please for ROV thanks

## 2024-08-09 ENCOUNTER — Ambulatory Visit (INDEPENDENT_AMBULATORY_CARE_PROVIDER_SITE_OTHER): Admitting: Internal Medicine

## 2024-08-09 ENCOUNTER — Encounter: Payer: Self-pay | Admitting: Internal Medicine

## 2024-08-09 VITALS — BP 130/84 | HR 60 | Temp 98.3°F | Ht 68.5 in | Wt 248.0 lb

## 2024-08-09 DIAGNOSIS — E7800 Pure hypercholesterolemia, unspecified: Secondary | ICD-10-CM

## 2024-08-09 DIAGNOSIS — M79642 Pain in left hand: Secondary | ICD-10-CM | POA: Diagnosis not present

## 2024-08-09 DIAGNOSIS — R079 Chest pain, unspecified: Secondary | ICD-10-CM

## 2024-08-09 DIAGNOSIS — M5416 Radiculopathy, lumbar region: Secondary | ICD-10-CM | POA: Diagnosis not present

## 2024-08-09 DIAGNOSIS — M79641 Pain in right hand: Secondary | ICD-10-CM | POA: Diagnosis not present

## 2024-08-09 DIAGNOSIS — E559 Vitamin D deficiency, unspecified: Secondary | ICD-10-CM | POA: Diagnosis not present

## 2024-08-09 DIAGNOSIS — I10 Essential (primary) hypertension: Secondary | ICD-10-CM | POA: Diagnosis not present

## 2024-08-09 DIAGNOSIS — M25511 Pain in right shoulder: Secondary | ICD-10-CM | POA: Diagnosis not present

## 2024-08-09 DIAGNOSIS — R739 Hyperglycemia, unspecified: Secondary | ICD-10-CM

## 2024-08-09 DIAGNOSIS — M1712 Unilateral primary osteoarthritis, left knee: Secondary | ICD-10-CM | POA: Diagnosis not present

## 2024-08-09 MED ORDER — VALSARTAN 160 MG PO TABS: 160.0000 mg | ORAL_TABLET | Freq: Every day | ORAL | 3 refills | Status: AC

## 2024-08-09 MED ORDER — MELOXICAM 15 MG PO TABS: 15.0000 mg | ORAL_TABLET | Freq: Every day | ORAL | 1 refills | Status: AC | PRN

## 2024-08-09 NOTE — Progress Notes (Signed)
 Patient ID: Carl Gardner, male   DOB: 11-Jul-1956, 68 y.o.   MRN: [id number]        Chief Complaint: follow up right shoulder pain, right wrist pain, right third trigger finger, left medial knee pain, right leg numbness only with lying down, htn, left parasternal tender chest pain, cannabis use       HPI:  Carl Gardner is a 68 y.o. male here with several complaints; has over 1 wk right shoulder pain worse with abduction and has reduced ROM, cannot raise overhead.  Also has left parasternal tender reproducible sharp chest wall soreness mild intermittent but concerns him given his ongoing cannabis use.  Pt denies other chest pain, increased sob or doe, wheezing, orthopnea, PND, increased LE swelling, palpitations, dizziness or syncope, or diaphoresis.  Also works his hands with brick every day,  has moderate worsening right hand pain numbess, as well as bilateral diffuse hands puffiness without other specific tenderness.  Does also have a right third trigger finger early mild but interferes with work.  Also has recent onset left medial knee pain without swelling, falls, or giveways, but is worse to stand.  Also, BP has been increased recently at home, now accepting for med tx.   Wt Readings from Last 3 Encounters:  08/09/24 248 lb (112.5 kg)  02/27/24 249 lb (112.9 kg)  11/23/23 249 lb (112.9 kg)   BP Readings from Last 3 Encounters:  08/09/24 130/84  08/01/24 (!) 158/90  11/16/23 (!) 160/90         Past Medical History:  Diagnosis Date   Arthritis    hands, knees   BPH (benign prostatic hypertrophy)    Gout    Hepaitis C    Hep C   History of cocaine abuse (HCC) 05/07/2021   HLD (hyperlipidemia) 05/07/2021   Substance abuse (HCC)    marijuana   Vitamin D  deficiency 05/07/2021   Past Surgical History:  Procedure Laterality Date   SHOULDER ARTHROSCOPY WITH DISTAL CLAVICLE RESECTION Left 06/30/2015   Procedure: SHOULDER ARTHROSCOPY WITH DISTAL CLAVICLE RESECTION; LABRAL TEAR  DEBRIDEMENT;  Surgeon: Eva Herring, MD;  Location: [facility];  Service: Orthopedics;  Laterality: Left;   SHOULDER ARTHROSCOPY WITH ROTATOR CUFF REPAIR AND SUBACROMIAL DECOMPRESSION Left 06/30/2015   Procedure: SHOULDER ARTHROSCOPY WITH ARTHROSCOPIC ROTATOR CUFF REPAIR, SUBACROMIAL DECOMPRESSION;  Surgeon: Eva Herring, MD;  Location:  SURGERY CENTER;  Service: Orthopedics;  Laterality: Left;   WRIST SURGERY Left     reports that he has never smoked. He has never been exposed to tobacco smoke. He has never used smokeless tobacco. He reports current alcohol use. He reports current drug use. Frequency: 3.00 times per week. Drug: Marijuana. family history is not on file. He was adopted. Allergies  Allergen Reactions   Penicillins Hives   Current Outpatient Medications on File Prior to Visit  Medication Sig Dispense Refill   Apoaequorin (PREVAGEN PO) Take by mouth daily.     Ascorbic Acid (VITAMIN C PO) Take by mouth daily.     colchicine  0.6 MG tablet Day one -Take two tablets at once, wait one hour and take another tablet. Day 2 and thereafter Take one tablet daily. 30 tablet 0   finasteride  (PROSCAR ) 5 MG tablet Take 1 tablet (5 mg total) by mouth daily. 30 tablet 5   hydrochlorothiazide  (MICROZIDE ) 12.5 MG capsule Take 1 capsule (12.5 mg total) by mouth daily. 90 capsule 3   indomethacin  (INDOCIN ) 50 MG capsule TAKE 1 CAPSULE BY  MOUTH THREE TIMES DAILY AS NEEDED FOR GOUT 90 capsule 3   Multiple Vitamin (MULTIVITAMIN ADULT PO) Take by mouth daily.     rosuvastatin  (CRESTOR ) 20 MG tablet Take 1 tablet (20 mg total) by mouth daily. 90 tablet 3   tamsulosin  (FLOMAX ) 0.4 MG CAPS capsule Take 2 capsules (0.8 mg total) by mouth daily. 180 capsule 3   VITAMIN D  PO Take by mouth daily.     No current facility-administered medications on file prior to visit.        ROS:  All others reviewed and negative.  Objective        PE:  BP 130/84 (BP Location: Right Arm,  Patient Position: Sitting, Cuff Size: Normal)   Pulse 60   Temp 98.3 F (36.8 C) (Oral)   Ht 5' 8.5 (1.74 m)   Wt 248 lb (112.5 kg)   SpO2 98%   BMI 37.16 kg/m                 Constitutional: Pt appears in NAD               HENT: Head: NCAT.                Right Ear: External ear normal.                 Left Ear: External ear normal.                Eyes: . Pupils are equal, round, and reactive to light. Conjunctivae and EOM are normal               Nose: without d/c or deformity               Neck: Neck supple. Gross normal ROM               Cardiovascular: Normal rate and regular rhythm; does have reproducible left mid parasternal chest wall tenderness,.                 Pulmonary/Chest: Effort normal and breath sounds without rales or wheezing.                Right shoulder with diffuse tender soreness, with abduction to 100 degrees only.  Bilateral hand have diffuse tender swelling and callouses, also has early mild third finger trigger, left medial knee with bony degenerative changes without overlying skin changes or swelling.                  Neurological: Pt is alert. At baseline orientation, motor grossly intact               Skin: Skin is warm. No rashes, no other new lesions, LE edema - none               Psychiatric: Pt behavior is normal without agitation   Micro: none  Cardiac tracings I have personally interpreted today:  none  Pertinent Radiological findings (summarize): none   Lab Results  Component Value Date   WBC 5.5 11/16/2023   HGB 14.5 11/16/2023   HCT 42.8 11/16/2023   PLT 167.0 11/16/2023   GLUCOSE 90 11/16/2023   CHOL 107 11/16/2023   TRIG 98.0 11/16/2023   HDL 40.10 11/16/2023   LDLCALC 47 11/16/2023   ALT 32 11/16/2023   AST 30 11/16/2023   NA 140 11/16/2023   K 4.6 11/16/2023   CL 104 11/16/2023   CREATININE 1.16 11/16/2023   BUN 24 (  H) 11/16/2023   CO2 29 11/16/2023   TSH 1.66 11/16/2023   PSA 0.49 11/16/2023   INR 0.9 09/22/2016    HGBA1C 6.2 11/16/2023   Assessment/Plan:  Carl Gardner is a 68 y.o. Black or African American [2] male with  has a past medical history of Arthritis, BPH (benign prostatic hypertrophy), Gout, Hepaitis C, History of cocaine abuse (HCC) (05/07/2021), HLD (hyperlipidemia) (05/07/2021), Substance abuse (HCC), and Vitamin D  deficiency (05/07/2021).  Vitamin D  deficiency Last vitamin D  Lab Results  Component Value Date   VD25OH 57.52 11/16/2023   Stable, cont oral replacement   HTN (hypertension) C/w essential htn - for start diovan 160 mg every day and f/u bp at home and next visit  Bilateral hand pain Right > left with overuse swelling overall, but suspect right CTS as well - for hand surgury referral, to include the early right 3rd trigger finger  Right lumbar radiculitis With chronic recurring low back pain, exam benign but likely related to lumbar disease, for orthopedic referral  Arthritis of left knee Mild intermittent, for mobic  15 mg prn  Acute pain of right shoulder Exam c/w likely right rotater cuff disorder, also for inclusion in referral to orthopedic, mobic  15 mg prn  Chest pain Reproducible left mid parasternal tender soreness without swelling or skin change, likely costochondritis, very low suspicion for cardiac, for mobic  15 mg prn  Followup: Return in about 6 months (around 02/07/2025).  Lynwood Rush, MD 08/12/2024 3:28 PM [facility]  Primary Care - [facility] Internal Medicine

## 2024-08-09 NOTE — Patient Instructions (Signed)
 Please take all new medication as prescribed - the diovan 160 mg per day for bp and continue to monitor at home as you do; let us  know in 1-2 wks if the BP is still elevated   Please take all new medication as prescribed - the meloxicam  15 mg every day as  needed  Please continue all other medications as before, and refills have been done if requested.  Please have the pharmacy call with any other refills you may need.  Please continue your efforts at being more active, low cholesterol diet, and weight control.  Please keep your appointments with your specialists as you may have planned  You will be contacted regarding the referral for: Orthopedic for the right shoulder, left knee arthritis, and right leg numbness with lying down  You will be contacted regarding the referral for: Hand surgury for possible carpal tunnel  We can hold on lab testing today  Please make an Appointment to return in 6 months, or sooner if needed

## 2024-08-12 ENCOUNTER — Encounter: Payer: Self-pay | Admitting: Internal Medicine

## 2024-08-12 DIAGNOSIS — M1712 Unilateral primary osteoarthritis, left knee: Secondary | ICD-10-CM | POA: Insufficient documentation

## 2024-08-12 DIAGNOSIS — M5416 Radiculopathy, lumbar region: Secondary | ICD-10-CM | POA: Insufficient documentation

## 2024-08-12 DIAGNOSIS — M25511 Pain in right shoulder: Secondary | ICD-10-CM | POA: Insufficient documentation

## 2024-08-12 NOTE — Assessment & Plan Note (Signed)
 With chronic recurring low back pain, exam benign but likely related to lumbar disease, for orthopedic referral

## 2024-08-12 NOTE — Assessment & Plan Note (Signed)
 Reproducible left mid parasternal tender soreness without swelling or skin change, likely costochondritis, very low suspicion for cardiac, for mobic  15 mg prn

## 2024-08-12 NOTE — Assessment & Plan Note (Signed)
 C/w essential htn - for start diovan 160 mg every day and f/u bp at home and next visit

## 2024-08-12 NOTE — Assessment & Plan Note (Signed)
 Right > left with overuse swelling overall, but suspect right CTS as well - for hand surgury referral, to include the early right 3rd trigger finger

## 2024-08-12 NOTE — Assessment & Plan Note (Signed)
 Last vitamin D Lab Results  Component Value Date   VD25OH 57.52 11/16/2023   Stable, cont oral replacement

## 2024-08-12 NOTE — Assessment & Plan Note (Signed)
 Mild intermittent, for mobic  15 mg prn

## 2024-08-12 NOTE — Assessment & Plan Note (Signed)
 Exam c/w likely right rotater cuff disorder, also for inclusion in referral to orthopedic, mobic  15 mg prn

## 2024-09-04 ENCOUNTER — Ambulatory Visit: Admitting: Orthopedic Surgery

## 2024-09-04 DIAGNOSIS — M654 Radial styloid tenosynovitis [de Quervain]: Secondary | ICD-10-CM

## 2024-09-04 DIAGNOSIS — G5601 Carpal tunnel syndrome, right upper limb: Secondary | ICD-10-CM | POA: Diagnosis not present

## 2024-09-04 DIAGNOSIS — M65331 Trigger finger, right middle finger: Secondary | ICD-10-CM | POA: Diagnosis not present

## 2024-09-04 NOTE — Progress Notes (Signed)
 Carl Gardner - 68 y.o. male MRN [id number]  Date of birth: October 11, 1956  Office Visit Note: Visit Date: 09/04/2024 PCP: Norleen Lynwood ORN, MD Referred by: Norleen Lynwood ORN, MD  Subjective: No chief complaint on file.  HPI: Carl Gardner is a pleasant 68 y.o. male who presents today for multitude of complaints to the bilateral hands, right greater than left.  In the right hand, he is describing ongoing triggering of the right long finger that is refractory to conservative care in the form of previous injection and activity modification.  He is also describing radial sided wrist pain that is progressive in nature with ongoing pain at the basilar aspect of the right thumb with thumb pinch and heavy grip.  He is also describing numbness and tingling throughout the right hand which is progressive in nature with associated nocturnal symptoms.  Pertinent ROS were reviewed with the patient and found to be negative unless otherwise specified above in HPI.   Visit Reason: bilateral hands Duration of symptoms:years Hand dominance: right Occupation: Construction/ Cook Diabetic: No Smoking: Yes/ Marijuana Heart/Lung History: none Blood Thinners: none  Prior Testing/EMG: xrays 1 year Injections (Date): 06/28/23 Treatments: injection right long finger trigger Prior Surgery: none    Assessment & Plan: Visit Diagnoses:  1. Trigger finger, right middle finger   2. De Quervain's tenosynovitis, right   3. Carpal tunnel syndrome, right upper limb     Plan: Extensive discussion was held with patient today regarding the multitude of right hand complaints that he is currently experiencing.  From a clinical standpoint, he is demonstrating signs symptoms consistent with right long finger trigger digit, right wrist de Quervain's tenosynovitis and carpal tunnel syndrome.  He also does have an element of right thumb CMC arthritis as well.  We discussed the underlying etiologies and pathophysiology of the  above conditions as well as treatments ranging from conservative to surgical.  He has undergone a significant amount of conservative treatment already for these complaints without resolution of symptoms.  I did explain that it would be helpful to obtain a right upper extremity electrodiagnostic study in order to better delineate the site and severity of potential nerve compression in this region.  I explained that should we proceed with surgical intervention, we could perform potential right long finger trigger digit release, right wrist first tensor compartment release and carpal tunnel release at the same time.  I also explained that should the thumb basilar joint arthritis progress from a clinical and radiographic standpoint, this could be a consideration for the future.  Understanding the above, he will proceed with right upper extremity electrodiagnostic study in the near future and return to me for further discussion.  I spent 30 minutes in the care of this patient today including review of previous documentation, imaging obtained, face-to-face time discussing all options regarding treatment and documenting the encounter.   Follow-up: No follow-ups on file.   Meds & Orders: No orders of the defined types were placed in this encounter.  No orders of the defined types were placed in this encounter.    Procedures: No procedures performed      Clinical History: No specialty comments available.  He reports that he has never smoked. He has never been exposed to tobacco smoke. He has never used smokeless tobacco.  Recent Labs    11/16/23 1105  HGBA1C 6.2    Objective:   Vital Signs: There were no vitals taken for this visit.  Physical Exam  Gen:  Well-appearing, in no acute distress; non-toxic CV: Regular Rate. Well-perfused. Warm.  Resp: Breathing unlabored on room air; no wheezing. Psych: Fluid speech in conversation; appropriate affect; normal thought process  Ortho Exam PHYSICAL  EXAM:  General: Patient is well appearing and in no distress.  Skin and Muscle: No significant skin changes are apparent to hands.  Muscle bulk and contour normal, no signs of atrophy.     Range of Motion and Palpation Tests: Mobility is full about the elbows with flexion and extension.  Forearm supination and pronation are 85/85 bilaterally.  Wrist flexion/extension is 75/65 bilaterally.  Digital flexion and extension are full.  Thumb opposition is full to the base of the small fingers bilaterally.    Palpable nodule right long finger A1 pulley region with associated tenderness.  Notable triggering with deep flexion of the right long finger.  Moderate tenderness over the thumb CMC articulations is observed bilaterally, right greater than left. Finklestein test is positive right.    Neurologic, Vascular, Motor: Sensation is diminished to light touch in the right median nerve distribution.  Positive Tinel's over the right carpal tunnel region.  Phalen's positive right, Derkan's compression positive right  Fingers pink and well perfused.  Capillary refill is brisk.      Lab Results  Component Value Date   HGBA1C 6.2 11/16/2023      Imaging: No results found.  Past Medical/Family/Surgical/Social History: Medications & Allergies reviewed per EMR, new medications updated. Patient Active Problem List   Diagnosis Date Noted   Right lumbar radiculitis 08/12/2024   Arthritis of left knee 08/12/2024   Acute pain of right shoulder 08/12/2024   Bilateral hand pain 05/17/2023   Cannabis use disorder 05/17/2023   Left sided sciatica 11/16/2022   Hyperglycemia 11/16/2022   HTN (hypertension) 11/16/2022   Pain and swelling of right knee 05/20/2022   Chest pain 05/20/2022   Bilateral wrist pain 05/20/2022   Scalp lesion 05/09/2021   History of cocaine abuse (HCC) 05/07/2021   Hematochezia 05/07/2021   HLD (hyperlipidemia) 05/07/2021   Vitamin D  deficiency 05/07/2021   Benign  prostatic hyperplasia with weak urinary stream 04/03/2018   Spermatic cord pain 04/03/2018   Acute gout involving toe of right foot 04/03/2018   Chronic hepatitis C without hepatic coma (HCC) 09/22/2016   Positive hepatitis C antibody test 08/16/2016   Encounter for routine adult health examination with abnormal findings 08/12/2016   Right upper quadrant abdominal tenderness without rebound tenderness 08/12/2016   Past Medical History:  Diagnosis Date   Arthritis    hands, knees   BPH (benign prostatic hypertrophy)    Gout    Hepaitis C    Hep C   History of cocaine abuse (HCC) 05/07/2021   HLD (hyperlipidemia) 05/07/2021   Substance abuse (HCC)    marijuana   Vitamin D  deficiency 05/07/2021   Family History  Adopted: Yes  Problem Relation Age of Onset   Colon cancer Neg Hx    Stomach cancer Neg Hx    Esophageal cancer Neg Hx    Pancreatic cancer Neg Hx    Past Surgical History:  Procedure Laterality Date   SHOULDER ARTHROSCOPY WITH DISTAL CLAVICLE RESECTION Left 06/30/2015   Procedure: SHOULDER ARTHROSCOPY WITH DISTAL CLAVICLE RESECTION; LABRAL TEAR DEBRIDEMENT;  Surgeon: Eva Herring, MD;  Location: [facility];  Service: Orthopedics;  Laterality: Left;   SHOULDER ARTHROSCOPY WITH ROTATOR CUFF REPAIR AND SUBACROMIAL DECOMPRESSION Left 06/30/2015   Procedure: SHOULDER ARTHROSCOPY WITH ARTHROSCOPIC ROTATOR CUFF REPAIR,  SUBACROMIAL DECOMPRESSION;  Surgeon: Eva Herring, MD;  Location: [facility];  Service: Orthopedics;  Laterality: Left;   WRIST SURGERY Left    Social History   Occupational History   Occupation: Caregiver  Tobacco Use   Smoking status: Never    Passive exposure: Never   Smokeless tobacco: Never  Vaping Use   Vaping status: Never Used  Substance and Sexual Activity   Alcohol use: Yes    Comment: occasional   Drug use: Yes    Frequency: 3.0 times per week    Types: Marijuana    Comment: past used cocaine- 25 years  ago   Sexual activity: Not Currently    Kenslei Hearty Estela) Arlinda, M.D. [facility], Hand Surgery

## 2024-09-06 ENCOUNTER — Other Ambulatory Visit: Payer: Self-pay | Admitting: Orthopedic Surgery

## 2024-09-06 DIAGNOSIS — G5601 Carpal tunnel syndrome, right upper limb: Secondary | ICD-10-CM

## 2024-09-18 NOTE — Progress Notes (Signed)
 The patient attended a screening event on 08/01/2024, where his blood pressure was measured at 158/90 mmHg after a second reading, and his blood glucose is 131 mg/dl. During the event, the patient reported that he does not smoke, has Medicaid and Medicare for his insurance, is established with a primary care provider Sydnee Rush), and he does not have any SDOH needs indicated at this time.  A chart review indicates Dr. Lynwood Rush - [facility] as his PCP. The patient's most recent office visit was on 08/09/2024 and he has an upcoming appt on 11/16/2024 10:20 AM. Pt's bp was addressed at this appt and he was started on medication for it. He has Medicare and Medicaid for his insurance. He does not have any SDOH needs indicated per chart review.   CHW sent courtesy letter with bp education and Healthy Cooking Class in case needed by the pt.   At this time, no additional support from the Health Equity Team is indicated.

## 2024-09-20 ENCOUNTER — Telehealth: Payer: Self-pay

## 2024-09-20 NOTE — Telephone Encounter (Signed)
 Copied from CRM #[id number]. Topic: Clinical - Medical Advice >> Sep 20, 2024 10:28 AM Anairis L wrote: Reason for CRM: Mr.Kegg is requesting for Dr.James John to recommend a new provider for him, within [facility] [employer]  Thank you.

## 2024-10-02 ENCOUNTER — Ambulatory Visit: Admitting: Physical Medicine and Rehabilitation

## 2024-10-02 DIAGNOSIS — R202 Paresthesia of skin: Secondary | ICD-10-CM

## 2024-10-02 DIAGNOSIS — M79641 Pain in right hand: Secondary | ICD-10-CM

## 2024-10-02 DIAGNOSIS — M654 Radial styloid tenosynovitis [de Quervain]: Secondary | ICD-10-CM

## 2024-10-02 DIAGNOSIS — R29898 Other symptoms and signs involving the musculoskeletal system: Secondary | ICD-10-CM

## 2024-10-02 NOTE — Progress Notes (Signed)
 Pain Scale   Average Pain 2 Patient advised he has numbness and tingling in the right hand also has weakness. Patiet is Right hand dominate         +Driver, -BT, -Dye Allergies.

## 2024-10-02 NOTE — Progress Notes (Signed)
 "  Carl Gardner - 68 y.o. male MRN [id number]  Date of birth: Oct 10, 1956  Office Visit Note: Visit Date: 10/02/2024 PCP: Norleen Lynwood ORN, MD Referred by: Arlinda Buster, MD  Subjective: Chief Complaint  Patient presents with   Right Hand - Pain, Numbness, Weakness   HPI: Carl Gardner is a 68 y.o. male who comes in today at the request of Dr. Anshul Agarwala for evaluation and management of chronic, worsening and severe pain, numbness and tingling in the Right upper extremities.  Patient is Right hand dominant.  He has a complicated clinical description of pain in the right hand particularly on the dorsal side of the forearm but also pain numbness and tingling globally.  He has had some triggering of the right long finger as well.  He has a history of some type of left wrist surgery remotely.  He does not have any history of cervical surgery does have a history of neck pain.  Has history of gout.  He is not diabetic.  He has not had electrodiagnostic studies in the past.  Symptoms seem to be occurring more frequently and can be worse at night but can be worse during the day with movement.  Again symptoms vary with where he is hurting.  He does get a lot of pain over the [facility] joint of the thumb as well.  He is experiencing weakness at times.   I spent more than 30 minutes speaking face-to-face with the patient with 50% of the time in counseling and discussing coordination of care.      Review of Systems  Musculoskeletal:  Positive for back pain, joint pain and neck pain.  Neurological:  Positive for tingling and focal weakness.  All other systems reviewed and are negative.  Otherwise per HPI.  Assessment & Plan: Visit Diagnoses:    ICD-10-CM   1. Paresthesia of skin  R20.2 NCV with EMG (electromyography)    2. De Quervain's tenosynovitis, right  M65.4     3. Pain in right hand  M79.641     4. Hand weakness  R29.898        Plan: Impression: Clinically most consistent  with [facility] joint arthritic pain and likely underlying carpal tunnel syndrome.  Electrodiagnostic study performed today.  The above electrodiagnostic study is ABNORMAL and reveals evidence of a moderate to severe right median nerve entrapment at the wrist (carpal tunnel syndrome) affecting sensory and motor components.   There is no significant electrodiagnostic evidence of any other focal nerve entrapment, brachial plexopathy or cervical radiculopathy.   Recommendations: 1.  Follow-up with referring physician. 2.  Continue current management of symptoms. 3.  Suggest surgical evaluation.  Careful clinical correlation with symptoms.  Meds & Orders: No orders of the defined types were placed in this encounter.   Orders Placed This Encounter  Procedures   NCV with EMG (electromyography)    Follow-up: Return for Anshul Agarwala, MD.   Procedures: No procedures performed  EMG & NCV Findings: Evaluation of the right median motor nerve showed prolonged distal onset latency (4.3 ms) and decreased conduction velocity (Elbow-Wrist, 40 m/s).  The right median (across palm) sensory nerve showed no response (Palm), prolonged distal peak latency (4.7 ms), and reduced amplitude (5.6 V).  All remaining nerves (as indicated in the following tables) were within normal limits.    All examined muscles (as indicated in the following table) showed no evidence of electrical instability.    Impression: The above electrodiagnostic study is ABNORMAL  and reveals evidence of a moderate to severe right median nerve entrapment at the wrist (carpal tunnel syndrome) affecting sensory and motor components.   There is no significant electrodiagnostic evidence of any other focal nerve entrapment, brachial plexopathy or cervical radiculopathy.   Recommendations: 1.  Follow-up with referring physician. 2.  Continue current management of symptoms. 3.  Suggest surgical evaluation.  Careful clinical correlation with  symptoms.  ___________________________ Prentice Masters FAAPMR Board Certified, American Board of Physical Medicine and Rehabilitation    Nerve Conduction Studies Anti Sensory Summary Table   Stim Site NR Peak (ms) Norm Peak (ms) P-T Amp (V) Norm P-T Amp Site1 Site2 Delta-P (ms) Dist (cm) Vel (m/s) Norm Vel (m/s)  Right Median Acr Palm Anti Sensory (2nd Digit)  30.8C  Wrist    *4.7 <3.6 *5.6 >10 Wrist Palm  0.0    Palm *NR  <2.0          Right Radial Anti Sensory (Base 1st Digit)  30.2C  Wrist    2.5 <3.1 4.3  Wrist Base 1st Digit 2.5 0.0    Site 2    7.8  8.6         Site 3    19.8  0.4         Right Ulnar Anti Sensory (5th Digit)  31.4C  Wrist    3.4 <3.7 17.7 >15.0 Wrist 5th Digit 3.4 14.0 41 >38   Motor Summary Table   Stim Site NR Onset (ms) Norm Onset (ms) O-P Amp (mV) Norm O-P Amp Site1 Site2 Delta-0 (ms) Dist (cm) Vel (m/s) Norm Vel (m/s)  Right Median Motor (Abd Poll Brev)  31.3C  Wrist    *4.3 <4.2 10.2 >5 Elbow Wrist 5.5 22.0 *40 >50  Elbow    9.8  0.3         Right Ulnar Motor (Abd Dig Min)  31.3C  Wrist    2.9 <4.2 8.0 >3 B Elbow Wrist 0.1 0.0  >53  B Elbow    3.0  7.7  A Elbow B Elbow 10.8 0.0  >53  A Elbow    13.8  0.0  Axilla A Elbow  0.0     EMG   Side Muscle Nerve Root Ins Act Fibs Psw Amp Dur Poly Recrt Int Bruna Comment  Right Abd Poll Brev Median C8-T1 Nml Nml Nml Nml Nml 0 Nml Nml   Right 1stDorInt Ulnar C8-T1 Nml Nml Nml Nml Nml 0 Nml Nml   Right PronatorTeres Median C6-7 Nml Nml Nml Nml Nml 0 Nml Nml   Right Biceps Musculocut C5-6 Nml Nml Nml Nml Nml 0 Nml Nml   Right Deltoid Axillary C5-6 Nml Nml Nml Nml Nml 0 Nml Nml     Nerve Conduction Studies Anti Sensory Left/Right Comparison   Stim Site L Lat (ms) R Lat (ms) L-R Lat (ms) L Amp (V) R Amp (V) L-R Amp (%) Site1 Site2 L Vel (m/s) R Vel (m/s) L-R Vel (m/s)  Median Acr Palm Anti Sensory (2nd Digit)  30.8C  Wrist  *4.7   *5.6  Wrist Palm     Palm             Radial Anti Sensory (Base 1st  Digit)  30.2C  Wrist  2.5   4.3  Wrist Base 1st Digit     Site 2  7.8   8.6        Site 3  19.8   0.4  Ulnar Anti Sensory (5th Digit)  31.4C  Wrist  3.4   17.7  Wrist 5th Digit  41    Motor Left/Right Comparison   Stim Site L Lat (ms) R Lat (ms) L-R Lat (ms) L Amp (mV) R Amp (mV) L-R Amp (%) Site1 Site2 L Vel (m/s) R Vel (m/s) L-R Vel (m/s)  Median Motor (Abd Poll Brev)  31.3C  Wrist  *4.3   10.2  Elbow Wrist  *40   Elbow  9.8   0.3        Ulnar Motor (Abd Dig Min)  31.3C  Wrist  2.9   8.0  B Elbow Wrist     B Elbow  3.0   7.7  A Elbow B Elbow     A Elbow  13.8   0.0  Axilla A Elbow        Waveforms:            Clinical History: No specialty comments available.   He reports that he has never smoked. He has never been exposed to tobacco smoke. He has never used smokeless tobacco.  Recent Labs    11/16/23 1105  HGBA1C 6.2    Objective:  VS:  HT:    WT:   BMI:     BP:   HR: bpm  TEMP: ( )  RESP:  Physical Exam Vitals and nursing note reviewed.  Constitutional:      General: He is not in acute distress.    Appearance: Normal appearance. He is well-developed. He is obese.  HENT:     Head: Normocephalic and atraumatic.  Eyes:     Conjunctiva/sclera: Conjunctivae normal.     Pupils: Pupils are equal, round, and reactive to light.  Cardiovascular:     Rate and Rhythm: Normal rate.     Pulses: Normal pulses.     Heart sounds: Normal heart sounds.  Pulmonary:     Effort: Pulmonary effort is normal. No respiratory distress.  Musculoskeletal:        General: Tenderness present.     Cervical back: Normal range of motion and neck supple. No rigidity.     Right lower leg: No edema.     Left lower leg: No edema.     Comments: Inspection reveals no atrophy of the bilateral APB or FDI or hand intrinsics. There is no swelling, color changes, allodynia or dystrophic changes. There is 5 out of 5 strength in the bilateral wrist extension, finger abduction and long  finger flexion. There is intact sensation to light touch in all dermatomal and peripheral nerve distributions. There is a negative Tinel's test at the bilateral wrist and elbow. There is a negative Phalen's test bilaterally. There is a negative Hoffmann's test bilaterally.  Skin:    General: Skin is warm and dry.     Findings: No erythema or rash.  Neurological:     General: No focal deficit present.     Mental Status: He is alert and oriented to person, place, and time.     Cranial Nerves: No cranial nerve deficit.     Sensory: No sensory deficit.     Motor: No weakness or abnormal muscle tone.     Coordination: Coordination normal.     Gait: Gait normal.  Psychiatric:        Mood and Affect: Mood normal.        Behavior: Behavior normal.        Thought Content: Thought content normal.  Ortho Exam  Imaging: No results found.  Past Medical/Family/Surgical/Social History: Medications & Allergies reviewed per EMR, new medications updated. Patient Active Problem List   Diagnosis Date Noted   Right lumbar radiculitis 08/12/2024   Arthritis of left knee 08/12/2024   Acute pain of right shoulder 08/12/2024   Bilateral hand pain 05/17/2023   Cannabis use disorder 05/17/2023   Left sided sciatica 11/16/2022   Hyperglycemia 11/16/2022   HTN (hypertension) 11/16/2022   Pain and swelling of right knee 05/20/2022   Chest pain 05/20/2022   Bilateral wrist pain 05/20/2022   Scalp lesion 05/09/2021   History of cocaine abuse (HCC) 05/07/2021   Hematochezia 05/07/2021   HLD (hyperlipidemia) 05/07/2021   Vitamin D  deficiency 05/07/2021   Benign prostatic hyperplasia with weak urinary stream 04/03/2018   Spermatic cord pain 04/03/2018   Acute gout involving toe of right foot 04/03/2018   Chronic hepatitis C without hepatic coma (HCC) 09/22/2016   Positive hepatitis C antibody test 08/16/2016   Encounter for routine adult health examination with abnormal findings 08/12/2016   Right  upper quadrant abdominal tenderness without rebound tenderness 08/12/2016   Past Medical History:  Diagnosis Date   Arthritis    hands, knees   BPH (benign prostatic hypertrophy)    Gout    Hepaitis C    Hep C   History of cocaine abuse (HCC) 05/07/2021   HLD (hyperlipidemia) 05/07/2021   Substance abuse (HCC)    marijuana   Vitamin D  deficiency 05/07/2021   Family History  Adopted: Yes  Problem Relation Age of Onset   Colon cancer Neg Hx    Stomach cancer Neg Hx    Esophageal cancer Neg Hx    Pancreatic cancer Neg Hx    Past Surgical History:  Procedure Laterality Date   SHOULDER ARTHROSCOPY WITH DISTAL CLAVICLE RESECTION Left 06/30/2015   Procedure: SHOULDER ARTHROSCOPY WITH DISTAL CLAVICLE RESECTION; LABRAL TEAR DEBRIDEMENT;  Surgeon: Eva Herring, MD;  Location: [facility];  Service: Orthopedics;  Laterality: Left;   SHOULDER ARTHROSCOPY WITH ROTATOR CUFF REPAIR AND SUBACROMIAL DECOMPRESSION Left 06/30/2015   Procedure: SHOULDER ARTHROSCOPY WITH ARTHROSCOPIC ROTATOR CUFF REPAIR, SUBACROMIAL DECOMPRESSION;  Surgeon: Eva Herring, MD;  Location: [facility];  Service: Orthopedics;  Laterality: Left;   WRIST SURGERY Left    Social History   Occupational History   Occupation: Caregiver  Tobacco Use   Smoking status: Never    Passive exposure: Never   Smokeless tobacco: Never  Vaping Use   Vaping status: Never Used  Substance and Sexual Activity   Alcohol use: Yes    Comment: occasional   Drug use: Yes    Frequency: 3.0 times per week    Types: Marijuana    Comment: past used cocaine- 25 years ago   Sexual activity: Not Currently    "

## 2024-10-08 NOTE — Procedures (Signed)
 EMG & NCV Findings: Evaluation of the right median motor nerve showed prolonged distal onset latency (4.3 ms) and decreased conduction velocity (Elbow-Wrist, 40 m/s).  The right median (across palm) sensory nerve showed no response (Palm), prolonged distal peak latency (4.7 ms), and reduced amplitude (5.6 V).  All remaining nerves (as indicated in the following tables) were within normal limits.    All examined muscles (as indicated in the following table) showed no evidence of electrical instability.    Impression: The above electrodiagnostic study is ABNORMAL and reveals evidence of a moderate to severe right median nerve entrapment at the wrist (carpal tunnel syndrome) affecting sensory and motor components.   There is no significant electrodiagnostic evidence of any other focal nerve entrapment, brachial plexopathy or cervical radiculopathy.   Recommendations: 1.  Follow-up with referring physician. 2.  Continue current management of symptoms. 3.  Suggest surgical evaluation.  Careful clinical correlation with symptoms.  ___________________________ Prentice Masters FAAPMR Board Certified, American Board of Physical Medicine and Rehabilitation    Nerve Conduction Studies Anti Sensory Summary Table   Stim Site NR Peak (ms) Norm Peak (ms) P-T Amp (V) Norm P-T Amp Site1 Site2 Delta-P (ms) Dist (cm) Vel (m/s) Norm Vel (m/s)  Right Median Acr Palm Anti Sensory (2nd Digit)  30.8C  Wrist    *4.7 <3.6 *5.6 >10 Wrist Palm  0.0    Palm *NR  <2.0          Right Radial Anti Sensory (Base 1st Digit)  30.2C  Wrist    2.5 <3.1 4.3  Wrist Base 1st Digit 2.5 0.0    Site 2    7.8  8.6         Site 3    19.8  0.4         Right Ulnar Anti Sensory (5th Digit)  31.4C  Wrist    3.4 <3.7 17.7 >15.0 Wrist 5th Digit 3.4 14.0 41 >38   Motor Summary Table   Stim Site NR Onset (ms) Norm Onset (ms) O-P Amp (mV) Norm O-P Amp Site1 Site2 Delta-0 (ms) Dist (cm) Vel (m/s) Norm Vel (m/s)  Right Median Motor  (Abd Poll Brev)  31.3C  Wrist    *4.3 <4.2 10.2 >5 Elbow Wrist 5.5 22.0 *40 >50  Elbow    9.8  0.3         Right Ulnar Motor (Abd Dig Min)  31.3C  Wrist    2.9 <4.2 8.0 >3 B Elbow Wrist 0.1 0.0  >53  B Elbow    3.0  7.7  A Elbow B Elbow 10.8 0.0  >53  A Elbow    13.8  0.0  Axilla A Elbow  0.0     EMG   Side Muscle Nerve Root Ins Act Fibs Psw Amp Dur Poly Recrt Int Bruna Comment  Right Abd Poll Brev Median C8-T1 Nml Nml Nml Nml Nml 0 Nml Nml   Right 1stDorInt Ulnar C8-T1 Nml Nml Nml Nml Nml 0 Nml Nml   Right PronatorTeres Median C6-7 Nml Nml Nml Nml Nml 0 Nml Nml   Right Biceps Musculocut C5-6 Nml Nml Nml Nml Nml 0 Nml Nml   Right Deltoid Axillary C5-6 Nml Nml Nml Nml Nml 0 Nml Nml     Nerve Conduction Studies Anti Sensory Left/Right Comparison   Stim Site L Lat (ms) R Lat (ms) L-R Lat (ms) L Amp (V) R Amp (V) L-R Amp (%) Site1 Site2 L Vel (m/s) R Vel (m/s) L-R Vel (m/s)  Median Acr Palm Anti Sensory (2nd Digit)  30.8C  Wrist  *4.7   *5.6  Wrist Palm     Palm             Radial Anti Sensory (Base 1st Digit)  30.2C  Wrist  2.5   4.3  Wrist Base 1st Digit     Site 2  7.8   8.6        Site 3  19.8   0.4        Ulnar Anti Sensory (5th Digit)  31.4C  Wrist  3.4   17.7  Wrist 5th Digit  41    Motor Left/Right Comparison   Stim Site L Lat (ms) R Lat (ms) L-R Lat (ms) L Amp (mV) R Amp (mV) L-R Amp (%) Site1 Site2 L Vel (m/s) R Vel (m/s) L-R Vel (m/s)  Median Motor (Abd Poll Brev)  31.3C  Wrist  *4.3   10.2  Elbow Wrist  *40   Elbow  9.8   0.3        Ulnar Motor (Abd Dig Min)  31.3C  Wrist  2.9   8.0  B Elbow Wrist     B Elbow  3.0   7.7  A Elbow B Elbow     A Elbow  13.8   0.0  Axilla A Elbow        Waveforms:

## 2024-10-10 ENCOUNTER — Encounter: Payer: Self-pay | Admitting: Physical Medicine and Rehabilitation

## 2024-10-17 ENCOUNTER — Ambulatory Visit: Admitting: Orthopedic Surgery

## 2024-10-17 ENCOUNTER — Encounter: Payer: Self-pay | Admitting: Orthopedic Surgery

## 2024-10-17 ENCOUNTER — Telehealth: Payer: Self-pay

## 2024-10-17 DIAGNOSIS — R202 Paresthesia of skin: Secondary | ICD-10-CM

## 2024-10-17 NOTE — Progress Notes (Unsigned)
 "  Office Visit Note   Patient: Carl Gardner           Date of Birth: [birth year]           MRN: [id number] Visit Date: 10/17/2024 Requested by: Norleen Lynwood ORN, MD [address] [place],  [place] [address] PCP: Norleen Lynwood ORN, MD  Subjective: Chief Complaint  Patient presents with   Right Hand - Pain, Numbness    Wants to discuss CTR and review EMG/NCV    HPI: Carl Gardner is a 68 y.o. male who presents to the office reporting right wrist pain.  Both hands and wrist hurt but the right is worse than the left.  He is right-hand dominant.  EMG and nerve study had been done which do show moderate to severe carpal tunnel syndrome on the right.  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  He needs a note to be cleared.  He does use a brace.  Also reports some pain at the base of the thumb.  Also describes long trigger finger ring.  It is hard for him to make a fist.  Patient is interested in 1 simple surgery that we will allow him to work.  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                ROS: All systems reviewed are negative as they relate to the chief complaint within the history of present illness.  Patient denies fevers or chills.  Assessment & Plan: Visit Diagnoses:  1. Paresthesia of skin     Plan: Impression is multiple pain generators in the right wrist of the patient he wants to be able to work by mid February.  I think the things that are most symptomatic in this patient are #1 [facility] joint #2 carpal tunnel median nerve compression #3 Long trigger finger.  If he wants to work in February his best bet would be to get carpal tunnel release and trigger finger done.  I think the [facility] thumb arthritis surgery would delay his return a little bit longer into February or possibly March.  He will follow-up with Dr. Erwin for further evaluation and management.  Follow-Up Instructions: No  follow-ups on file.   Orders:  No orders of the defined types were placed in this encounter.  No orders of the defined types were placed in this encounter.     Procedures: No procedures performed   Clinical Data: No additional findings.  Objective: Vital Signs: There were no vitals taken for this visit.  Physical Exam:  Constitutional: Patient appears well-developed HEENT:  Head: Normocephalic Eyes:EOM are normal Neck: Normal range of motion Cardiovascular: Normal rate Pulmonary/chest: Effort normal Neurologic: Patient is alert Skin: Skin is warm Psychiatric: Patient has normal mood and affect  Ortho Exam: Ortho exam demonstrates about 15 degrees less palmar flexion and dorsiflexion of the right wrist versus left.  No discrete tenderness over the radial styloid but he does have some tenderness to palpation in the snuffbox as well as at the base of the [facility] joint on the right.  Positive grind test on the right.  EPL FPL interosseous strength is intact.  Negative Tinel's cubital tunnel.  No abductor pollicis brevis wasting.  Finklestein's test is negative on the right.  Specialty Comments:  No specialty comments available.  Imaging: No results found.   PMFS History: Patient Active Problem List  Diagnosis Date Noted   Right lumbar radiculitis 08/12/2024   Arthritis of left knee 08/12/2024   Acute pain of right shoulder 08/12/2024   Bilateral hand pain 05/17/2023   Cannabis use disorder 05/17/2023   Left sided sciatica 11/16/2022   Hyperglycemia 11/16/2022   HTN (hypertension) 11/16/2022   Pain and swelling of right knee 05/20/2022   Chest pain 05/20/2022   Bilateral wrist pain 05/20/2022   Scalp lesion 05/09/2021   History of cocaine abuse (HCC) 05/07/2021   Hematochezia 05/07/2021   HLD (hyperlipidemia) 05/07/2021   Vitamin D  deficiency 05/07/2021   Benign prostatic hyperplasia with weak urinary stream 04/03/2018   Spermatic cord pain 04/03/2018   Acute gout  involving toe of right foot 04/03/2018   Chronic hepatitis C without hepatic coma (HCC) 09/22/2016   Positive hepatitis C antibody test 08/16/2016   Encounter for routine adult health examination with abnormal findings 08/12/2016   Right upper quadrant abdominal tenderness without rebound tenderness 08/12/2016   Past Medical History:  Diagnosis Date   Arthritis    hands, knees   BPH (benign prostatic hypertrophy)    Gout    Hepaitis C    Hep C   History of cocaine abuse (HCC) 05/07/2021   HLD (hyperlipidemia) 05/07/2021   Substance abuse (HCC)    marijuana   Vitamin D  deficiency 05/07/2021    Family History  Adopted: Yes  Problem Relation Age of Onset   Colon cancer Neg Hx    Stomach cancer Neg Hx    Esophageal cancer Neg Hx    Pancreatic cancer Neg Hx     Past Surgical History:  Procedure Laterality Date   SHOULDER ARTHROSCOPY WITH DISTAL CLAVICLE RESECTION Left 06/30/2015   Procedure: SHOULDER ARTHROSCOPY WITH DISTAL CLAVICLE RESECTION; LABRAL TEAR DEBRIDEMENT;  Surgeon: Eva Herring, MD;  Location: [facility];  Service: Orthopedics;  Laterality: Left;   SHOULDER ARTHROSCOPY WITH ROTATOR CUFF REPAIR AND SUBACROMIAL DECOMPRESSION Left 06/30/2015   Procedure: SHOULDER ARTHROSCOPY WITH ARTHROSCOPIC ROTATOR CUFF REPAIR, SUBACROMIAL DECOMPRESSION;  Surgeon: Eva Herring, MD;  Location: [facility];  Service: Orthopedics;  Laterality: Left;   WRIST SURGERY Left    Social History   Occupational History   Occupation: Caregiver  Tobacco Use   Smoking status: Never    Passive exposure: Never   Smokeless tobacco: Never  Vaping Use   Vaping status: Never Used  Substance and Sexual Activity   Alcohol use: Yes    Comment: occasional   Drug use: Yes    Frequency: 3.0 times per week    Types: Marijuana    Comment: past used cocaine- 25 years ago   Sexual activity: Not Currently        "

## 2024-10-17 NOTE — Telephone Encounter (Signed)
 Patient somehow was scheduled with Dr Addie today to review his EMG/NCV He is ready to proceed with surgery. Can you all get him in to see Dr Erwin to discuss surgery? He is wanting something done sooner rather than later. He mentioned he has a pending job offer.

## 2024-10-20 ENCOUNTER — Other Ambulatory Visit: Payer: Self-pay | Admitting: Internal Medicine

## 2024-10-22 ENCOUNTER — Other Ambulatory Visit: Payer: Self-pay

## 2024-10-26 ENCOUNTER — Other Ambulatory Visit: Payer: Self-pay

## 2024-10-26 ENCOUNTER — Other Ambulatory Visit: Payer: Self-pay | Admitting: Internal Medicine

## 2024-10-29 NOTE — Progress Notes (Unsigned)
 EMG follow up; discuss surgery

## 2024-10-30 ENCOUNTER — Ambulatory Visit: Admitting: Orthopedic Surgery

## 2024-10-30 DIAGNOSIS — M65331 Trigger finger, right middle finger: Secondary | ICD-10-CM

## 2024-10-30 DIAGNOSIS — G5601 Carpal tunnel syndrome, right upper limb: Secondary | ICD-10-CM | POA: Diagnosis not present

## 2024-10-30 DIAGNOSIS — M654 Radial styloid tenosynovitis [de Quervain]: Secondary | ICD-10-CM | POA: Diagnosis not present

## 2024-11-07 ENCOUNTER — Other Ambulatory Visit: Payer: Self-pay

## 2024-11-07 DIAGNOSIS — M654 Radial styloid tenosynovitis [de Quervain]: Secondary | ICD-10-CM

## 2024-11-07 DIAGNOSIS — M65331 Trigger finger, right middle finger: Secondary | ICD-10-CM

## 2024-11-07 DIAGNOSIS — G5601 Carpal tunnel syndrome, right upper limb: Secondary | ICD-10-CM

## 2024-11-15 ENCOUNTER — Other Ambulatory Visit: Payer: Self-pay

## 2024-11-15 DIAGNOSIS — M65331 Trigger finger, right middle finger: Secondary | ICD-10-CM

## 2024-11-15 DIAGNOSIS — M654 Radial styloid tenosynovitis [de Quervain]: Secondary | ICD-10-CM

## 2024-11-15 DIAGNOSIS — G5601 Carpal tunnel syndrome, right upper limb: Secondary | ICD-10-CM

## 2024-11-16 ENCOUNTER — Ambulatory Visit: Payer: Self-pay | Admitting: Internal Medicine

## 2024-11-16 ENCOUNTER — Ambulatory Visit: Admitting: Internal Medicine

## 2024-11-16 ENCOUNTER — Encounter: Payer: Self-pay | Admitting: Internal Medicine

## 2024-11-16 VITALS — BP 142/80 | HR 70 | Temp 98.3°F | Ht 68.5 in | Wt 252.0 lb

## 2024-11-16 DIAGNOSIS — E559 Vitamin D deficiency, unspecified: Secondary | ICD-10-CM | POA: Diagnosis not present

## 2024-11-16 DIAGNOSIS — I10 Essential (primary) hypertension: Secondary | ICD-10-CM

## 2024-11-16 DIAGNOSIS — E7800 Pure hypercholesterolemia, unspecified: Secondary | ICD-10-CM

## 2024-11-16 DIAGNOSIS — R739 Hyperglycemia, unspecified: Secondary | ICD-10-CM

## 2024-11-16 DIAGNOSIS — Z125 Encounter for screening for malignant neoplasm of prostate: Secondary | ICD-10-CM

## 2024-11-16 LAB — BASIC METABOLIC PANEL WITH GFR
BUN: 24 mg/dL — ABNORMAL HIGH (ref 6–23)
CO2: 29 meq/L (ref 19–32)
Calcium: 9.6 mg/dL (ref 8.4–10.5)
Chloride: 104 meq/L (ref 96–112)
Creatinine, Ser: 1.18 mg/dL (ref 0.40–1.50)
GFR: 63.74 mL/min
Glucose, Bld: 117 mg/dL — ABNORMAL HIGH (ref 70–99)
Potassium: 4.1 meq/L (ref 3.5–5.1)
Sodium: 142 meq/L (ref 135–145)

## 2024-11-16 LAB — URINALYSIS, ROUTINE W REFLEX MICROSCOPIC
Bilirubin Urine: NEGATIVE
Hgb urine dipstick: NEGATIVE
Ketones, ur: NEGATIVE
Leukocytes,Ua: NEGATIVE
Nitrite: NEGATIVE
Specific Gravity, Urine: 1.02 (ref 1.000–1.030)
Total Protein, Urine: NEGATIVE
Urine Glucose: NEGATIVE
Urobilinogen, UA: 0.2 (ref 0.0–1.0)
pH: 6 (ref 5.0–8.0)

## 2024-11-16 LAB — LIPID PANEL
Cholesterol: 115 mg/dL (ref 28–200)
HDL: 39.4 mg/dL
LDL Cholesterol: 51 mg/dL (ref 10–99)
NonHDL: 75.67
Total CHOL/HDL Ratio: 3
Triglycerides: 121 mg/dL (ref 10.0–149.0)
VLDL: 24.2 mg/dL (ref 0.0–40.0)

## 2024-11-16 LAB — CBC WITH DIFFERENTIAL/PLATELET
Basophils Absolute: 0 10*3/uL (ref 0.0–0.1)
Basophils Relative: 0.2 % (ref 0.0–3.0)
Eosinophils Absolute: 0 10*3/uL (ref 0.0–0.7)
Eosinophils Relative: 0.5 % (ref 0.0–5.0)
HCT: 43.1 % (ref 39.0–52.0)
Hemoglobin: 14.6 g/dL (ref 13.0–17.0)
Lymphocytes Relative: 46.9 % — ABNORMAL HIGH (ref 12.0–46.0)
Lymphs Abs: 3 10*3/uL (ref 0.7–4.0)
MCHC: 33.8 g/dL (ref 30.0–36.0)
MCV: 99.8 fl (ref 78.0–100.0)
Monocytes Absolute: 0.6 10*3/uL (ref 0.1–1.0)
Monocytes Relative: 8.7 % (ref 3.0–12.0)
Neutro Abs: 2.8 10*3/uL (ref 1.4–7.7)
Neutrophils Relative %: 43.7 % (ref 43.0–77.0)
Platelets: 179 10*3/uL (ref 150.0–400.0)
RBC: 4.32 Mil/uL (ref 4.22–5.81)
RDW: 13.4 % (ref 11.5–15.5)
WBC: 6.5 10*3/uL (ref 4.0–10.5)

## 2024-11-16 LAB — HEMOGLOBIN A1C: Hgb A1c MFr Bld: 5.9 % (ref 4.6–6.5)

## 2024-11-16 LAB — HEPATIC FUNCTION PANEL
ALT: 34 U/L (ref 3–53)
AST: 25 U/L (ref 5–37)
Albumin: 4.8 g/dL (ref 3.5–5.2)
Alkaline Phosphatase: 76 U/L (ref 39–117)
Bilirubin, Direct: 0.2 mg/dL (ref 0.1–0.3)
Total Bilirubin: 0.9 mg/dL (ref 0.2–1.2)
Total Protein: 7.4 g/dL (ref 6.0–8.3)

## 2024-11-16 LAB — PSA: PSA: 0.52 ng/mL (ref 0.10–4.00)

## 2024-11-16 LAB — VITAMIN D 25 HYDROXY (VIT D DEFICIENCY, FRACTURES): VITD: 47.92 ng/mL (ref 30.00–100.00)

## 2024-11-16 LAB — TSH: TSH: 2.48 u[IU]/mL (ref 0.35–5.50)

## 2024-11-16 NOTE — Progress Notes (Signed)
 The test results show that your current treatment is OK, as the tests are stable.  Please continue the same plan.  There is no other need for change of treatment or further evaluation based on these results, at this time.  thanks

## 2024-11-16 NOTE — Patient Instructions (Signed)
 Please continue all other medications as before, and refills have been done if requested.  Please have the pharmacy call with any other refills you may need.  Please continue your efforts at being more active, low cholesterol diet, and weight control.  You are otherwise up to date with prevention measures today.  Please keep your appointments with your specialists as you may have planned  Please go to the LAB at the blood drawing area for the tests to be done  You will be contacted by phone if any changes need to be made immediately.  Otherwise, you will receive a letter about your results with an explanation, but please check with MyChart first.  Please make an Appointment to return in 6 months, or sooner if needed to Nurse Practitioner

## 2024-11-17 ENCOUNTER — Encounter: Payer: Self-pay | Admitting: Internal Medicine

## 2024-11-17 NOTE — Assessment & Plan Note (Signed)
 Lab Results  Component Value Date   HGBA1C 5.9 11/16/2024   Stable, pt to continue current medical treatment  - diet, wt control

## 2024-11-17 NOTE — Assessment & Plan Note (Signed)
 BP Readings from Last 3 Encounters:  11/16/24 (!) 142/80  08/09/24 130/84  08/01/24 (!) 158/90   Uncontrolled but pt states controlled at home, pt to continue medical treatment hct 12.5 every day, diovan  160 every day, declines other change today

## 2024-11-17 NOTE — Assessment & Plan Note (Signed)
 Lab Results  Component Value Date   LDLCALC 51 11/16/2024   Stable, pt to continue current statin crestor  20 mg qd

## 2024-11-28 ENCOUNTER — Encounter: Admitting: Orthopedic Surgery

## 2024-12-13 ENCOUNTER — Ambulatory Visit

## 2024-12-13 ENCOUNTER — Encounter: Admitting: Orthopedic Surgery

## 2025-02-27 ENCOUNTER — Ambulatory Visit
